# Patient Record
Sex: Female | Born: 1950
Health system: Southern US, Community
[De-identification: ages and names within clinical notes are randomized; demographics above are authoritative.]

## PROBLEM LIST (undated history)

## (undated) DIAGNOSIS — I10 Essential (primary) hypertension: Secondary | ICD-10-CM

## (undated) DIAGNOSIS — J302 Other seasonal allergic rhinitis: Secondary | ICD-10-CM

## (undated) HISTORY — DX: Other seasonal allergic rhinitis: J30.2

## (undated) HISTORY — PX: TRIGGER FINGER RELEASE: SHX641

## (undated) HISTORY — PX: CARPAL TUNNEL RELEASE: SHX101

## (undated) HISTORY — DX: Essential (primary) hypertension: I10

## (undated) HISTORY — PX: SPINAL FUSION: SHX223

---

## 2018-12-06 DIAGNOSIS — L570 Actinic keratosis: Secondary | ICD-10-CM | POA: Diagnosis not present

## 2018-12-06 DIAGNOSIS — L579 Skin changes due to chronic exposure to nonionizing radiation, unspecified: Secondary | ICD-10-CM | POA: Diagnosis not present

## 2019-01-23 DIAGNOSIS — L570 Actinic keratosis: Secondary | ICD-10-CM | POA: Diagnosis not present

## 2019-02-02 DIAGNOSIS — I10 Essential (primary) hypertension: Secondary | ICD-10-CM | POA: Diagnosis not present

## 2019-02-02 DIAGNOSIS — E782 Mixed hyperlipidemia: Secondary | ICD-10-CM | POA: Diagnosis not present

## 2019-02-14 DIAGNOSIS — R05 Cough: Secondary | ICD-10-CM | POA: Diagnosis not present

## 2019-02-14 DIAGNOSIS — E782 Mixed hyperlipidemia: Secondary | ICD-10-CM | POA: Diagnosis not present

## 2019-02-14 DIAGNOSIS — I781 Nevus, non-neoplastic: Secondary | ICD-10-CM | POA: Diagnosis not present

## 2019-02-14 DIAGNOSIS — R69 Illness, unspecified: Secondary | ICD-10-CM | POA: Diagnosis not present

## 2019-02-14 DIAGNOSIS — R7301 Impaired fasting glucose: Secondary | ICD-10-CM | POA: Diagnosis not present

## 2019-02-14 DIAGNOSIS — I10 Essential (primary) hypertension: Secondary | ICD-10-CM | POA: Diagnosis not present

## 2019-02-14 DIAGNOSIS — Z6828 Body mass index (BMI) 28.0-28.9, adult: Secondary | ICD-10-CM | POA: Diagnosis not present

## 2019-02-21 DIAGNOSIS — L820 Inflamed seborrheic keratosis: Secondary | ICD-10-CM | POA: Diagnosis not present

## 2019-02-21 DIAGNOSIS — I781 Nevus, non-neoplastic: Secondary | ICD-10-CM | POA: Diagnosis not present

## 2019-02-21 DIAGNOSIS — Z6828 Body mass index (BMI) 28.0-28.9, adult: Secondary | ICD-10-CM | POA: Diagnosis not present

## 2019-03-16 DIAGNOSIS — Z6829 Body mass index (BMI) 29.0-29.9, adult: Secondary | ICD-10-CM | POA: Diagnosis not present

## 2019-03-16 DIAGNOSIS — R05 Cough: Secondary | ICD-10-CM | POA: Diagnosis not present

## 2019-03-16 DIAGNOSIS — I10 Essential (primary) hypertension: Secondary | ICD-10-CM | POA: Diagnosis not present

## 2019-03-16 DIAGNOSIS — E782 Mixed hyperlipidemia: Secondary | ICD-10-CM | POA: Diagnosis not present

## 2019-05-14 DIAGNOSIS — H04123 Dry eye syndrome of bilateral lacrimal glands: Secondary | ICD-10-CM | POA: Diagnosis not present

## 2019-06-04 DIAGNOSIS — R05 Cough: Secondary | ICD-10-CM | POA: Diagnosis not present

## 2019-06-04 DIAGNOSIS — Z20828 Contact with and (suspected) exposure to other viral communicable diseases: Secondary | ICD-10-CM | POA: Diagnosis not present

## 2019-06-04 DIAGNOSIS — Z6829 Body mass index (BMI) 29.0-29.9, adult: Secondary | ICD-10-CM | POA: Diagnosis not present

## 2019-06-04 DIAGNOSIS — M25511 Pain in right shoulder: Secondary | ICD-10-CM | POA: Diagnosis not present

## 2019-06-06 DIAGNOSIS — R05 Cough: Secondary | ICD-10-CM | POA: Diagnosis not present

## 2019-06-12 DIAGNOSIS — M67813 Other specified disorders of tendon, right shoulder: Secondary | ICD-10-CM | POA: Diagnosis not present

## 2019-06-12 DIAGNOSIS — M7551 Bursitis of right shoulder: Secondary | ICD-10-CM | POA: Diagnosis not present

## 2019-07-04 DIAGNOSIS — R05 Cough: Secondary | ICD-10-CM | POA: Diagnosis not present

## 2019-07-04 DIAGNOSIS — Z6828 Body mass index (BMI) 28.0-28.9, adult: Secondary | ICD-10-CM | POA: Diagnosis not present

## 2019-07-04 DIAGNOSIS — M25511 Pain in right shoulder: Secondary | ICD-10-CM | POA: Diagnosis not present

## 2019-08-03 DIAGNOSIS — Z1231 Encounter for screening mammogram for malignant neoplasm of breast: Secondary | ICD-10-CM | POA: Diagnosis not present

## 2019-10-17 DIAGNOSIS — I10 Essential (primary) hypertension: Secondary | ICD-10-CM | POA: Diagnosis not present

## 2019-10-17 DIAGNOSIS — E782 Mixed hyperlipidemia: Secondary | ICD-10-CM | POA: Diagnosis not present

## 2019-10-17 DIAGNOSIS — R7301 Impaired fasting glucose: Secondary | ICD-10-CM | POA: Diagnosis not present

## 2019-10-24 DIAGNOSIS — R7301 Impaired fasting glucose: Secondary | ICD-10-CM | POA: Diagnosis not present

## 2019-10-24 DIAGNOSIS — E782 Mixed hyperlipidemia: Secondary | ICD-10-CM | POA: Diagnosis not present

## 2019-10-24 DIAGNOSIS — K219 Gastro-esophageal reflux disease without esophagitis: Secondary | ICD-10-CM | POA: Diagnosis not present

## 2019-10-24 DIAGNOSIS — Z23 Encounter for immunization: Secondary | ICD-10-CM | POA: Diagnosis not present

## 2019-10-24 DIAGNOSIS — R69 Illness, unspecified: Secondary | ICD-10-CM | POA: Diagnosis not present

## 2019-10-24 DIAGNOSIS — Z0001 Encounter for general adult medical examination with abnormal findings: Secondary | ICD-10-CM | POA: Diagnosis not present

## 2019-10-24 DIAGNOSIS — Z6829 Body mass index (BMI) 29.0-29.9, adult: Secondary | ICD-10-CM | POA: Diagnosis not present

## 2019-10-24 DIAGNOSIS — I10 Essential (primary) hypertension: Secondary | ICD-10-CM | POA: Diagnosis not present

## 2019-11-05 DIAGNOSIS — H26493 Other secondary cataract, bilateral: Secondary | ICD-10-CM | POA: Diagnosis not present

## 2019-11-20 DIAGNOSIS — Z03818 Encounter for observation for suspected exposure to other biological agents ruled out: Secondary | ICD-10-CM | POA: Diagnosis not present

## 2019-12-11 DIAGNOSIS — K5730 Diverticulosis of large intestine without perforation or abscess without bleeding: Secondary | ICD-10-CM | POA: Diagnosis not present

## 2019-12-11 DIAGNOSIS — Z1211 Encounter for screening for malignant neoplasm of colon: Secondary | ICD-10-CM | POA: Diagnosis not present

## 2019-12-11 DIAGNOSIS — D128 Benign neoplasm of rectum: Secondary | ICD-10-CM | POA: Diagnosis not present

## 2019-12-31 DIAGNOSIS — Z20828 Contact with and (suspected) exposure to other viral communicable diseases: Secondary | ICD-10-CM | POA: Diagnosis not present

## 2020-01-27 DIAGNOSIS — Z20828 Contact with and (suspected) exposure to other viral communicable diseases: Secondary | ICD-10-CM | POA: Diagnosis not present

## 2020-02-05 DIAGNOSIS — M7552 Bursitis of left shoulder: Secondary | ICD-10-CM | POA: Diagnosis not present

## 2020-02-05 DIAGNOSIS — R69 Illness, unspecified: Secondary | ICD-10-CM | POA: Diagnosis not present

## 2020-02-05 DIAGNOSIS — I10 Essential (primary) hypertension: Secondary | ICD-10-CM | POA: Diagnosis not present

## 2020-02-05 DIAGNOSIS — M7542 Impingement syndrome of left shoulder: Secondary | ICD-10-CM | POA: Diagnosis not present

## 2020-02-25 DIAGNOSIS — H524 Presbyopia: Secondary | ICD-10-CM | POA: Diagnosis not present

## 2020-05-13 ENCOUNTER — Other Ambulatory Visit: Payer: Self-pay | Admitting: Physician Assistant

## 2020-05-13 DIAGNOSIS — M25849 Other specified joint disorders, unspecified hand: Secondary | ICD-10-CM | POA: Diagnosis not present

## 2020-05-13 DIAGNOSIS — I10 Essential (primary) hypertension: Secondary | ICD-10-CM | POA: Diagnosis not present

## 2020-05-13 DIAGNOSIS — E559 Vitamin D deficiency, unspecified: Secondary | ICD-10-CM | POA: Diagnosis not present

## 2020-05-13 DIAGNOSIS — E785 Hyperlipidemia, unspecified: Secondary | ICD-10-CM | POA: Diagnosis not present

## 2020-05-13 DIAGNOSIS — E041 Nontoxic single thyroid nodule: Secondary | ICD-10-CM | POA: Diagnosis not present

## 2020-05-13 DIAGNOSIS — E2839 Other primary ovarian failure: Secondary | ICD-10-CM | POA: Diagnosis not present

## 2020-05-13 DIAGNOSIS — R69 Illness, unspecified: Secondary | ICD-10-CM | POA: Diagnosis not present

## 2020-05-13 DIAGNOSIS — R05 Cough: Secondary | ICD-10-CM | POA: Diagnosis not present

## 2020-05-14 ENCOUNTER — Other Ambulatory Visit: Payer: Self-pay | Admitting: Physician Assistant

## 2020-05-14 DIAGNOSIS — E041 Nontoxic single thyroid nodule: Secondary | ICD-10-CM

## 2020-05-22 ENCOUNTER — Other Ambulatory Visit: Payer: Self-pay | Admitting: Physician Assistant

## 2020-05-22 DIAGNOSIS — E2839 Other primary ovarian failure: Secondary | ICD-10-CM

## 2020-05-26 ENCOUNTER — Ambulatory Visit
Admission: RE | Admit: 2020-05-26 | Discharge: 2020-05-26 | Disposition: A | Discharge status: Home / Self Care | Payer: Medicare HMO | Source: Ambulatory Visit | Attending: Physician Assistant | Admitting: Physician Assistant

## 2020-05-26 DIAGNOSIS — E041 Nontoxic single thyroid nodule: Secondary | ICD-10-CM

## 2020-05-26 DIAGNOSIS — E042 Nontoxic multinodular goiter: Secondary | ICD-10-CM | POA: Diagnosis not present

## 2020-06-04 DIAGNOSIS — M7552 Bursitis of left shoulder: Secondary | ICD-10-CM | POA: Diagnosis not present

## 2020-06-04 DIAGNOSIS — M1811 Unilateral primary osteoarthritis of first carpometacarpal joint, right hand: Secondary | ICD-10-CM | POA: Diagnosis not present

## 2020-06-04 DIAGNOSIS — M1812 Unilateral primary osteoarthritis of first carpometacarpal joint, left hand: Secondary | ICD-10-CM | POA: Diagnosis not present

## 2020-07-03 DIAGNOSIS — M7552 Bursitis of left shoulder: Secondary | ICD-10-CM | POA: Diagnosis not present

## 2020-07-03 DIAGNOSIS — M1811 Unilateral primary osteoarthritis of first carpometacarpal joint, right hand: Secondary | ICD-10-CM | POA: Diagnosis not present

## 2020-07-03 DIAGNOSIS — M1812 Unilateral primary osteoarthritis of first carpometacarpal joint, left hand: Secondary | ICD-10-CM | POA: Diagnosis not present

## 2020-07-07 ENCOUNTER — Ambulatory Visit
Admission: RE | Admit: 2020-07-07 | Discharge: 2020-07-07 | Disposition: A | Discharge status: Home / Self Care | Payer: Medicare HMO | Source: Ambulatory Visit | Attending: Physician Assistant | Admitting: Physician Assistant

## 2020-07-07 ENCOUNTER — Other Ambulatory Visit: Payer: Self-pay

## 2020-07-07 DIAGNOSIS — E2839 Other primary ovarian failure: Secondary | ICD-10-CM

## 2020-07-07 DIAGNOSIS — Z780 Asymptomatic menopausal state: Secondary | ICD-10-CM | POA: Diagnosis not present

## 2020-07-07 DIAGNOSIS — M85852 Other specified disorders of bone density and structure, left thigh: Secondary | ICD-10-CM | POA: Diagnosis not present

## 2020-07-16 DIAGNOSIS — R69 Illness, unspecified: Secondary | ICD-10-CM | POA: Diagnosis not present

## 2020-07-30 DIAGNOSIS — R69 Illness, unspecified: Secondary | ICD-10-CM | POA: Diagnosis not present

## 2020-08-05 DIAGNOSIS — R69 Illness, unspecified: Secondary | ICD-10-CM | POA: Diagnosis not present

## 2020-09-15 DIAGNOSIS — R69 Illness, unspecified: Secondary | ICD-10-CM | POA: Diagnosis not present

## 2020-11-26 DIAGNOSIS — H52223 Regular astigmatism, bilateral: Secondary | ICD-10-CM | POA: Diagnosis not present

## 2020-11-26 DIAGNOSIS — I10 Essential (primary) hypertension: Secondary | ICD-10-CM | POA: Diagnosis not present

## 2020-11-26 DIAGNOSIS — E7800 Pure hypercholesterolemia, unspecified: Secondary | ICD-10-CM | POA: Diagnosis not present

## 2020-12-11 DIAGNOSIS — Z20822 Contact with and (suspected) exposure to covid-19: Secondary | ICD-10-CM | POA: Diagnosis not present

## 2020-12-12 DIAGNOSIS — Z961 Presence of intraocular lens: Secondary | ICD-10-CM | POA: Diagnosis not present

## 2020-12-12 DIAGNOSIS — I10 Essential (primary) hypertension: Secondary | ICD-10-CM | POA: Diagnosis not present

## 2020-12-12 DIAGNOSIS — H26492 Other secondary cataract, left eye: Secondary | ICD-10-CM | POA: Diagnosis not present

## 2020-12-16 DIAGNOSIS — H26492 Other secondary cataract, left eye: Secondary | ICD-10-CM | POA: Diagnosis not present

## 2020-12-24 DIAGNOSIS — L570 Actinic keratosis: Secondary | ICD-10-CM | POA: Diagnosis not present

## 2021-01-02 DIAGNOSIS — H26491 Other secondary cataract, right eye: Secondary | ICD-10-CM | POA: Diagnosis not present

## 2021-01-19 DIAGNOSIS — Z0100 Encounter for examination of eyes and vision without abnormal findings: Secondary | ICD-10-CM | POA: Diagnosis not present

## 2021-01-27 DIAGNOSIS — I10 Essential (primary) hypertension: Secondary | ICD-10-CM | POA: Diagnosis not present

## 2021-01-27 DIAGNOSIS — Z79890 Hormone replacement therapy (postmenopausal): Secondary | ICD-10-CM | POA: Diagnosis not present

## 2021-01-27 DIAGNOSIS — Z1231 Encounter for screening mammogram for malignant neoplasm of breast: Secondary | ICD-10-CM | POA: Diagnosis not present

## 2021-01-27 DIAGNOSIS — E559 Vitamin D deficiency, unspecified: Secondary | ICD-10-CM | POA: Diagnosis not present

## 2021-01-27 DIAGNOSIS — Z0000 Encounter for general adult medical examination without abnormal findings: Secondary | ICD-10-CM | POA: Diagnosis not present

## 2021-01-27 DIAGNOSIS — H9191 Unspecified hearing loss, right ear: Secondary | ICD-10-CM | POA: Diagnosis not present

## 2021-01-27 DIAGNOSIS — R69 Illness, unspecified: Secondary | ICD-10-CM | POA: Diagnosis not present

## 2021-01-27 DIAGNOSIS — M85859 Other specified disorders of bone density and structure, unspecified thigh: Secondary | ICD-10-CM | POA: Diagnosis not present

## 2021-01-27 DIAGNOSIS — E785 Hyperlipidemia, unspecified: Secondary | ICD-10-CM | POA: Diagnosis not present

## 2021-01-30 ENCOUNTER — Other Ambulatory Visit: Payer: Self-pay | Admitting: Family Medicine

## 2021-01-30 DIAGNOSIS — Z1231 Encounter for screening mammogram for malignant neoplasm of breast: Secondary | ICD-10-CM

## 2021-03-03 DIAGNOSIS — F419 Anxiety disorder, unspecified: Secondary | ICD-10-CM | POA: Diagnosis not present

## 2021-03-03 DIAGNOSIS — I10 Essential (primary) hypertension: Secondary | ICD-10-CM | POA: Diagnosis not present

## 2021-03-03 DIAGNOSIS — E871 Hypo-osmolality and hyponatremia: Secondary | ICD-10-CM | POA: Diagnosis not present

## 2021-03-03 DIAGNOSIS — R69 Illness, unspecified: Secondary | ICD-10-CM | POA: Diagnosis not present

## 2021-04-06 DIAGNOSIS — H903 Sensorineural hearing loss, bilateral: Secondary | ICD-10-CM | POA: Diagnosis not present

## 2021-04-06 DIAGNOSIS — L299 Pruritus, unspecified: Secondary | ICD-10-CM | POA: Diagnosis not present

## 2021-04-20 ENCOUNTER — Telehealth: Payer: Self-pay | Admitting: Pulmonary Disease

## 2021-04-22 ENCOUNTER — Ambulatory Visit: Payer: Medicare HMO | Admitting: Pulmonary Disease

## 2021-04-22 ENCOUNTER — Other Ambulatory Visit: Payer: Self-pay

## 2021-04-22 ENCOUNTER — Encounter: Payer: Self-pay | Admitting: Pulmonary Disease

## 2021-04-22 VITALS — BP 116/76 | HR 82 | Temp 98.0°F | Ht <= 58 in | Wt 133.0 lb

## 2021-04-22 DIAGNOSIS — J302 Other seasonal allergic rhinitis: Secondary | ICD-10-CM

## 2021-04-22 DIAGNOSIS — R911 Solitary pulmonary nodule: Secondary | ICD-10-CM

## 2021-04-22 DIAGNOSIS — R053 Chronic cough: Secondary | ICD-10-CM | POA: Diagnosis not present

## 2021-04-22 DIAGNOSIS — K449 Diaphragmatic hernia without obstruction or gangrene: Secondary | ICD-10-CM | POA: Diagnosis not present

## 2021-04-22 NOTE — Patient Instructions (Addendum)
Thank you for visiting Dr. Valeta Harms at [facility]. Today we recommend the following:  Orders Placed This Encounter  Procedures  . CT CHEST HIGH RESOLUTION   Return in about 4 weeks (around 05/20/2021) for with APP or Dr. Valeta Harms.    Please do your part to reduce the spread of COVID-19.

## 2021-04-22 NOTE — Telephone Encounter (Signed)
Pt seen today 04/22/21 by Dr. Valeta Harms. Will close encounter.

## 2021-04-22 NOTE — Progress Notes (Signed)
Synopsis: Referred in June 2022 for chronic cough, PCP: By Caren Macadam, MD  Subjective:   PATIENT ID: Tanya Barron GENDER: female DOB: 03-30-1951, MRN: [id number]  Chief Complaint  Patient presents with  . Consult    Dry cough    PMH of chronic cough. She had work up for chronic cough 7 years ago. She has seen ENT and had a barium swallow study.  She states this work-up was relatively unrevealing.  Her cough is episodic at times.  Occurs at nighttime.  Currently on an antihistamine and PPI.  She has not noticed much difference in her symptoms throughout the years.  It tends to be episodic.  She does not associate it with any mealtime variance.  She does have reflux symptoms at times.  She feels like she can control her cough sometimes by manipulating her upper neck.  It feels like she can point to the location that causes a slight tickle in her throat which irritates the cough.  Denies hemoptysis.  No other warning symptoms.    Past Medical History:  Diagnosis Date  . HTN (hypertension)   . Seasonal allergies      Family History  Problem Relation Age of Onset  . Alzheimer's disease Mother   . Heart disease Father      Past Surgical History:  Procedure Laterality Date  . CARPAL TUNNEL RELEASE    . SPINAL FUSION    . TRIGGER FINGER RELEASE      Social History   Socioeconomic History  . Marital status: Divorced    Spouse name: Not on file  . Number of children: Not on file  . Years of education: Not on file  . Highest education level: Not on file  Occupational History  . Not on file  Tobacco Use  . Smoking status: Former Smoker    Packs/day: 1.00    Years: 12.00    Pack years: 12.00    Quit date: 1985    Years since quitting: 37.4  . Smokeless tobacco: Never Used  Substance and Sexual Activity  . Alcohol use: Yes  . Drug use: Not on file  . Sexual activity: Not on file  Other Topics Concern  . Not on file  Social History Narrative  . Not on file    Social Determinants of Health   Financial Resource Strain: Not on file  Food Insecurity: Not on file  Transportation Needs: Not on file  Physical Activity: Not on file  Stress: Not on file  Social Connections: Not on file  Intimate Partner Violence: Not on file     Not on File   Outpatient Medications Prior to Visit  Medication Sig Dispense Refill  . amLODipine (NORVASC) 5 MG tablet     . atorvastatin (LIPITOR) 40 MG tablet Take 1 tablet by mouth daily.    . cetirizine (ZYRTEC) 10 MG tablet Take by mouth.    . pantoprazole (PROTONIX) 40 MG tablet Take 1 tablet by mouth daily.    Marland Kitchen PARoxetine (PAXIL) 40 MG tablet Take 60 mg by mouth daily.    Marland Kitchen triamterene-hydrochlorothiazide (DYAZIDE) 37.5-25 MG capsule      No facility-administered medications prior to visit.    Review of Systems  Constitutional: Negative for chills, fever, malaise/fatigue and weight loss.  HENT: Negative for hearing loss, sore throat and tinnitus.   Eyes: Negative for blurred vision and double vision.  Respiratory: Positive for cough. Negative for hemoptysis, sputum production, shortness of breath, wheezing and stridor.  Cardiovascular: Negative for chest pain, palpitations, orthopnea, leg swelling and PND.  Gastrointestinal: Negative for abdominal pain, constipation, diarrhea, heartburn, nausea and vomiting.  Genitourinary: Negative for dysuria, hematuria and urgency.  Musculoskeletal: Negative for joint pain and myalgias.  Skin: Negative for itching and rash.  Neurological: Negative for dizziness, tingling, weakness and headaches.  Endo/Heme/Allergies: Negative for environmental allergies. Does not bruise/bleed easily.  Psychiatric/Behavioral: Negative for depression. The patient is not nervous/anxious and does not have insomnia.   All other systems reviewed and are negative.    Objective:  Physical Exam Vitals reviewed.  Constitutional:      General: She is not in acute distress.     Appearance: She is well-developed.  HENT:     Head: Normocephalic and atraumatic.  Eyes:     General: No scleral icterus.    Conjunctiva/sclera: Conjunctivae normal.     Pupils: Pupils are equal, round, and reactive to light.  Neck:     Vascular: No JVD.     Trachea: No tracheal deviation.  Cardiovascular:     Rate and Rhythm: Normal rate and regular rhythm.     Heart sounds: Normal heart sounds. No murmur heard.   Pulmonary:     Effort: Pulmonary effort is normal. No tachypnea, accessory muscle usage or respiratory distress.     Breath sounds: Normal breath sounds. No stridor. No wheezing, rhonchi or rales.  Abdominal:     General: Bowel sounds are normal. There is no distension.     Palpations: Abdomen is soft.     Tenderness: There is no abdominal tenderness.  Musculoskeletal:        General: No tenderness.     Cervical back: Neck supple.     Comments: Arthritis of the hands  Lymphadenopathy:     Cervical: No cervical adenopathy.  Skin:    General: Skin is warm and dry.     Capillary Refill: Capillary refill takes less than 2 seconds.     Findings: No rash.  Neurological:     Mental Status: She is alert and oriented to person, place, and time.  Psychiatric:        Behavior: Behavior normal.      Vitals:   04/22/21 0921  BP: 116/76  Pulse: 82  Temp: 98 F (36.7 C)  SpO2: 95%  Weight: 133 lb (60.3 kg)  Height: 4\' 10"  (1.473 m)   95% on RA BMI Readings from Last 3 Encounters:  04/22/21 27.80 kg/m   Wt Readings from Last 3 Encounters:  04/22/21 133 lb (60.3 kg)    CBC No results found for: WBC, RBC, HGB, HCT, PLT, MCV, MCH, MCHC, RDW, LYMPHSABS, MONOABS, EOSABS, BASOSABS  Chest Imaging: No CT imaging on file.  Pulmonary Functions Testing Results: No flowsheet data found.  FeNO:  Pathology:  Echocardiogram:  Heart Catheterization:     Assessment & Plan:     ICD-10-CM   1. Chronic cough  R05.3 CT CHEST HIGH RESOLUTION  2. Lung nodule  R91.1 CT  CHEST HIGH RESOLUTION  3. Hiatal hernia  K44.9 CT CHEST HIGH RESOLUTION  4. Seasonal allergies  J30.2     Discussion:  This is a 70 year old female, chronic cough for 10+ years, history of lung nodule, no previous CT imaging to review.  This was all work-up that was completed in [place] prior to her moving down here a year ago.  She has a hiatal hernia.  Also no imaging to review.  Additional diagnosis of seasonal allergies.  Was recently  seen by ENT due to hearing loss as well as complaints of ongoing cough.  Cough has been present for some time.  Plan: Will recommend having CT imaging of the chest for evaluation of lung nodule and hiatal hernia. This also give [place] a look at the lung parenchyma for ongoing chronic cough issues. If her hiatal hernia is big enough to consider referral for having this fixed.  We will set her up with one of the thoracic surgeons who does this robotically.  Patient is agreeable to this plan. Can continue current PPI and antihistamine regimen for cough.  Patient to return to see me or APP in clinic after CT imaging of the chest   Current Outpatient Medications:  .  amLODipine (NORVASC) 5 MG tablet, , Disp: , Rfl:  .  atorvastatin (LIPITOR) 40 MG tablet, Take 1 tablet by mouth daily., Disp: , Rfl:  .  cetirizine (ZYRTEC) 10 MG tablet, Take by mouth., Disp: , Rfl:  .  pantoprazole (PROTONIX) 40 MG tablet, Take 1 tablet by mouth daily., Disp: , Rfl:  .  PARoxetine (PAXIL) 40 MG tablet, Take 60 mg by mouth daily., Disp: , Rfl:  .  triamterene-hydrochlorothiazide (DYAZIDE) 37.5-25 MG capsule, , Disp: , Rfl:     Garner Nash, DO [facility] 04/22/2021 10:07 AM

## 2021-04-30 DIAGNOSIS — N952 Postmenopausal atrophic vaginitis: Secondary | ICD-10-CM | POA: Diagnosis not present

## 2021-04-30 DIAGNOSIS — Z01419 Encounter for gynecological examination (general) (routine) without abnormal findings: Secondary | ICD-10-CM | POA: Diagnosis not present

## 2021-05-07 ENCOUNTER — Ambulatory Visit
Admission: RE | Admit: 2021-05-07 | Discharge: 2021-05-07 | Disposition: A | Discharge status: Home / Self Care | Payer: Medicare HMO | Source: Ambulatory Visit | Attending: Family Medicine | Admitting: Family Medicine

## 2021-05-07 ENCOUNTER — Other Ambulatory Visit: Payer: Self-pay

## 2021-05-07 DIAGNOSIS — Z1231 Encounter for screening mammogram for malignant neoplasm of breast: Secondary | ICD-10-CM

## 2021-05-08 ENCOUNTER — Ambulatory Visit
Admission: RE | Admit: 2021-05-08 | Discharge: 2021-05-08 | Disposition: A | Discharge status: Home / Self Care | Payer: Medicare HMO | Source: Ambulatory Visit | Attending: Pulmonary Disease | Admitting: Pulmonary Disease

## 2021-05-08 DIAGNOSIS — R911 Solitary pulmonary nodule: Secondary | ICD-10-CM

## 2021-05-08 DIAGNOSIS — I700 Atherosclerosis of aorta: Secondary | ICD-10-CM | POA: Diagnosis not present

## 2021-05-08 DIAGNOSIS — M419 Scoliosis, unspecified: Secondary | ICD-10-CM | POA: Diagnosis not present

## 2021-05-08 DIAGNOSIS — I2510 Atherosclerotic heart disease of native coronary artery without angina pectoris: Secondary | ICD-10-CM | POA: Diagnosis not present

## 2021-05-08 DIAGNOSIS — J479 Bronchiectasis, uncomplicated: Secondary | ICD-10-CM | POA: Diagnosis not present

## 2021-05-08 DIAGNOSIS — K449 Diaphragmatic hernia without obstruction or gangrene: Secondary | ICD-10-CM

## 2021-05-08 DIAGNOSIS — R053 Chronic cough: Secondary | ICD-10-CM

## 2021-05-12 ENCOUNTER — Other Ambulatory Visit: Payer: Self-pay

## 2021-05-12 ENCOUNTER — Ambulatory Visit (INDEPENDENT_AMBULATORY_CARE_PROVIDER_SITE_OTHER): Payer: Medicare HMO | Admitting: Psychology

## 2021-05-12 DIAGNOSIS — R69 Illness, unspecified: Secondary | ICD-10-CM | POA: Diagnosis not present

## 2021-05-12 DIAGNOSIS — F418 Other specified anxiety disorders: Secondary | ICD-10-CM | POA: Diagnosis not present

## 2021-05-22 ENCOUNTER — Other Ambulatory Visit: Payer: Self-pay

## 2021-05-22 ENCOUNTER — Ambulatory Visit: Payer: Medicare HMO | Admitting: Pulmonary Disease

## 2021-05-22 ENCOUNTER — Encounter: Payer: Self-pay | Admitting: Pulmonary Disease

## 2021-05-22 VITALS — BP 118/76 | HR 105 | Ht <= 58 in | Wt 135.0 lb

## 2021-05-22 DIAGNOSIS — R053 Chronic cough: Secondary | ICD-10-CM | POA: Diagnosis not present

## 2021-05-22 DIAGNOSIS — J302 Other seasonal allergic rhinitis: Secondary | ICD-10-CM | POA: Diagnosis not present

## 2021-05-22 DIAGNOSIS — R911 Solitary pulmonary nodule: Secondary | ICD-10-CM | POA: Diagnosis not present

## 2021-05-22 DIAGNOSIS — I7090 Unspecified atherosclerosis: Secondary | ICD-10-CM

## 2021-05-22 DIAGNOSIS — Q859 Phakomatosis, unspecified: Secondary | ICD-10-CM

## 2021-05-22 NOTE — Patient Instructions (Signed)
Thank you for visiting Dr. Valeta Harms at [facility]. Today we recommend the following:  Orders Placed This Encounter  Procedures   Ambulatory referral to Cardiology   Ambulatory referral to ENT   Let us know if symptoms get worse  Return in about 1 year (around 05/22/2022), or if symptoms worsen or fail to improve, for with APP or Dr. Valeta Harms.    Please do your part to reduce the spread of COVID-19.

## 2021-05-22 NOTE — Progress Notes (Signed)
Synopsis: Referred in June 2022 for chronic cough, PCP: By Jenny Reichmann, P*  Subjective:   PATIENT ID: Tanya Barron GENDER: female DOB: 09-04-1951, MRN: [id number]  Chief Complaint  Patient presents with   Cough    PMH of chronic cough. She had work up for chronic cough 7 years ago. She has seen ENT and had a barium swallow study.  She states this work-up was relatively unrevealing.  Her cough is episodic at times.  Occurs at nighttime.  Currently on an antihistamine and PPI.  She has not noticed much difference in her symptoms throughout the years.  It tends to be episodic.  She does not associate it with any mealtime variance.  She does have reflux symptoms at times.  She feels like she can control her cough sometimes by manipulating her upper neck.  It feels like she can point to the location that causes a slight tickle in her throat which irritates the cough.  Denies hemoptysis.  No other warning symptoms.  OV 05/22/2021: Here for follow-up after recent CT scan of the chest.  Still has chronic cough.  Has not changed much.  CT scan findings with small Bochdalek hernia.  Unfortunately still has a cough.  CT scan did not reveal much of the potential underlying etiology.  She does not have a hiatal hernia. Overall, no issues today except for the persistent cough.      Past Medical History:  Diagnosis Date   HTN (hypertension)    Seasonal allergies      Family History  Problem Relation Age of Onset   Alzheimer's disease Mother    Heart disease Father      Past Surgical History:  Procedure Laterality Date   CARPAL TUNNEL RELEASE     SPINAL FUSION     TRIGGER FINGER RELEASE      Social History   Socioeconomic History   Marital status: Divorced    Spouse name: Not on file   Number of children: Not on file   Years of education: Not on file   Highest education level: Not on file  Occupational History   Not on file  Tobacco Use   Smoking status: Former     Packs/day: 1.00    Years: 12.00    Pack years: 12.00    Types: Cigarettes    Quit date: 42    Years since quitting: 37.5   Smokeless tobacco: Never  Substance and Sexual Activity   Alcohol use: Yes   Drug use: Not on file   Sexual activity: Not on file  Other Topics Concern   Not on file  Social History Narrative   Not on file   Social Determinants of Health   Financial Resource Strain: Not on file  Food Insecurity: Not on file  Transportation Needs: Not on file  Physical Activity: Not on file  Stress: Not on file  Social Connections: Not on file  Intimate Partner Violence: Not on file     Allergies  Allergen Reactions   Sulfamethoxazole-Trimethoprim Hives     Outpatient Medications Prior to Visit  Medication Sig Dispense Refill   amLODipine (NORVASC) 5 MG tablet      atorvastatin (LIPITOR) 40 MG tablet Take 1 tablet by mouth daily.     cetirizine (ZYRTEC) 10 MG tablet Take by mouth.     pantoprazole (PROTONIX) 40 MG tablet Take 1 tablet by mouth daily.     PARoxetine (PAXIL) 40 MG tablet Take 60 mg by mouth  daily.     triamterene-hydrochlorothiazide (DYAZIDE) 37.5-25 MG capsule      No facility-administered medications prior to visit.    Review of Systems  Constitutional:  Negative for chills, fever, malaise/fatigue and weight loss.  HENT:  Negative for hearing loss, sore throat and tinnitus.   Eyes:  Negative for blurred vision and double vision.  Respiratory:  Positive for cough. Negative for hemoptysis, sputum production, shortness of breath, wheezing and stridor.   Cardiovascular:  Negative for chest pain, palpitations, orthopnea, leg swelling and PND.  Gastrointestinal:  Negative for abdominal pain, constipation, diarrhea, heartburn, nausea and vomiting.  Genitourinary:  Negative for dysuria, hematuria and urgency.  Musculoskeletal:  Negative for joint pain and myalgias.  Skin:  Negative for itching and rash.  Neurological:  Negative for dizziness,  tingling, weakness and headaches.  Endo/Heme/Allergies:  Negative for environmental allergies. Does not bruise/bleed easily.  Psychiatric/Behavioral:  Negative for depression. The patient is not nervous/anxious and does not have insomnia.   All other systems reviewed and are negative.   Objective:  Physical Exam Vitals reviewed.  Constitutional:      General: She is not in acute distress.    Appearance: She is well-developed.  HENT:     Head: Normocephalic and atraumatic.  Eyes:     General: No scleral icterus.    Conjunctiva/sclera: Conjunctivae normal.     Pupils: Pupils are equal, round, and reactive to light.  Neck:     Vascular: No JVD.     Trachea: No tracheal deviation.  Cardiovascular:     Rate and Rhythm: Normal rate and regular rhythm.     Heart sounds: Normal heart sounds. No murmur heard. Pulmonary:     Effort: Pulmonary effort is normal. No tachypnea, accessory muscle usage or respiratory distress.     Breath sounds: Normal breath sounds. No stridor. No wheezing, rhonchi or rales.  Abdominal:     General: Bowel sounds are normal. There is no distension.     Palpations: Abdomen is soft.     Tenderness: There is no abdominal tenderness.  Musculoskeletal:        General: No tenderness.     Cervical back: Neck supple.     Comments: Severe kyphoscoliosis   Lymphadenopathy:     Cervical: No cervical adenopathy.  Skin:    General: Skin is warm and dry.     Capillary Refill: Capillary refill takes less than 2 seconds.     Findings: No rash.  Neurological:     Mental Status: She is alert and oriented to person, place, and time.  Psychiatric:        Behavior: Behavior normal.     Vitals:   05/22/21 1452  BP: 118/76  Pulse: (!) 105  SpO2: 95%  Weight: 135 lb (61.2 kg)  Height: 4\' 10"  (1.473 m)   95% on RA BMI Readings from Last 3 Encounters:  05/22/21 28.22 kg/m  04/22/21 27.80 kg/m   Wt Readings from Last 3 Encounters:  05/22/21 135 lb (61.2 kg)   04/22/21 133 lb (60.3 kg)    CBC No results found for: WBC, RBC, HGB, HCT, PLT, MCV, MCH, MCHC, RDW, LYMPHSABS, MONOABS, EOSABS, BASOSABS  Chest Imaging:  CT Chest 05/08/2021: Small hamartoma, atherosclerosis, no ILD  Small hernia   Pulmonary Functions Testing Results: No flowsheet data found.  FeNO:  Pathology:  Echocardiogram:  Heart Catheterization:     Assessment & Plan:     ICD-10-CM   1. Chronic cough  R05.3 Ambulatory  referral to ENT    2. Atherosclerosis  I70.90 Ambulatory referral to Cardiology    3. Lung nodule  R91.1     4. Seasonal allergies  J30.2     5. Hamartoma ([place])  Q85.9       Discussion:  70 yo FM, chronic cough, 10 + years, lung nodule (hamartoma). Was seen by ENT before in [place]. Does have seasonal allergies.   Plan:  Reviewed CT imaging today  No additional need for lung nodule follow up  Continue allergy medications  She would like referred to see ENT again I recommended seeing a laryngologist which hasnt seen before.  As for the atherosclerosis on her CT I will refer her to cardiology and see if she would benefit from ct coronary.   Can see me or app in 1 year or as needed   Current Outpatient Medications:    amLODipine (NORVASC) 5 MG tablet, , Disp: , Rfl:    atorvastatin (LIPITOR) 40 MG tablet, Take 1 tablet by mouth daily., Disp: , Rfl:    cetirizine (ZYRTEC) 10 MG tablet, Take by mouth., Disp: , Rfl:    pantoprazole (PROTONIX) 40 MG tablet, Take 1 tablet by mouth daily., Disp: , Rfl:    PARoxetine (PAXIL) 40 MG tablet, Take 60 mg by mouth daily., Disp: , Rfl:    triamterene-hydrochlorothiazide (DYAZIDE) 37.5-25 MG capsule, , Disp: , Rfl:     Garner Nash, DO [place] [facility] 05/22/2021 3:02 PM

## 2021-05-26 ENCOUNTER — Ambulatory Visit (INDEPENDENT_AMBULATORY_CARE_PROVIDER_SITE_OTHER): Payer: Medicare HMO | Admitting: Psychology

## 2021-05-26 DIAGNOSIS — R69 Illness, unspecified: Secondary | ICD-10-CM | POA: Diagnosis not present

## 2021-05-26 DIAGNOSIS — F418 Other specified anxiety disorders: Secondary | ICD-10-CM

## 2021-06-09 ENCOUNTER — Other Ambulatory Visit: Payer: Self-pay

## 2021-06-09 ENCOUNTER — Ambulatory Visit (INDEPENDENT_AMBULATORY_CARE_PROVIDER_SITE_OTHER): Payer: Medicare HMO | Admitting: Psychology

## 2021-06-09 DIAGNOSIS — F418 Other specified anxiety disorders: Secondary | ICD-10-CM | POA: Diagnosis not present

## 2021-06-09 DIAGNOSIS — R69 Illness, unspecified: Secondary | ICD-10-CM | POA: Diagnosis not present

## 2021-06-12 DIAGNOSIS — R053 Chronic cough: Secondary | ICD-10-CM | POA: Diagnosis not present

## 2021-06-12 DIAGNOSIS — K219 Gastro-esophageal reflux disease without esophagitis: Secondary | ICD-10-CM | POA: Diagnosis not present

## 2021-06-12 DIAGNOSIS — J3489 Other specified disorders of nose and nasal sinuses: Secondary | ICD-10-CM | POA: Diagnosis not present

## 2021-06-12 DIAGNOSIS — J343 Hypertrophy of nasal turbinates: Secondary | ICD-10-CM | POA: Diagnosis not present

## 2021-06-30 ENCOUNTER — Ambulatory Visit (INDEPENDENT_AMBULATORY_CARE_PROVIDER_SITE_OTHER): Payer: Medicare HMO | Admitting: Psychology

## 2021-06-30 DIAGNOSIS — F418 Other specified anxiety disorders: Secondary | ICD-10-CM | POA: Diagnosis not present

## 2021-06-30 DIAGNOSIS — R69 Illness, unspecified: Secondary | ICD-10-CM | POA: Diagnosis not present

## 2021-07-14 ENCOUNTER — Other Ambulatory Visit: Payer: Self-pay

## 2021-07-14 ENCOUNTER — Ambulatory Visit (INDEPENDENT_AMBULATORY_CARE_PROVIDER_SITE_OTHER): Payer: Medicare HMO | Admitting: Psychology

## 2021-07-14 DIAGNOSIS — F418 Other specified anxiety disorders: Secondary | ICD-10-CM

## 2021-07-14 DIAGNOSIS — R69 Illness, unspecified: Secondary | ICD-10-CM | POA: Diagnosis not present

## 2021-07-15 DIAGNOSIS — R0989 Other specified symptoms and signs involving the circulatory and respiratory systems: Secondary | ICD-10-CM | POA: Diagnosis not present

## 2021-07-15 DIAGNOSIS — R053 Chronic cough: Secondary | ICD-10-CM | POA: Diagnosis not present

## 2021-07-15 DIAGNOSIS — K219 Gastro-esophageal reflux disease without esophagitis: Secondary | ICD-10-CM | POA: Diagnosis not present

## 2021-07-22 ENCOUNTER — Other Ambulatory Visit: Payer: Self-pay

## 2021-07-22 ENCOUNTER — Encounter (HOSPITAL_BASED_OUTPATIENT_CLINIC_OR_DEPARTMENT_OTHER): Payer: Self-pay | Admitting: Cardiology

## 2021-07-22 ENCOUNTER — Ambulatory Visit (HOSPITAL_BASED_OUTPATIENT_CLINIC_OR_DEPARTMENT_OTHER): Payer: Medicare HMO | Admitting: Cardiology

## 2021-07-22 VITALS — BP 112/80 | HR 82 | Ht <= 58 in | Wt 136.6 lb

## 2021-07-22 DIAGNOSIS — I2510 Atherosclerotic heart disease of native coronary artery without angina pectoris: Secondary | ICD-10-CM | POA: Diagnosis not present

## 2021-07-22 DIAGNOSIS — Z8249 Family history of ischemic heart disease and other diseases of the circulatory system: Secondary | ICD-10-CM

## 2021-07-22 DIAGNOSIS — I700 Atherosclerosis of aorta: Secondary | ICD-10-CM

## 2021-07-22 DIAGNOSIS — I10 Essential (primary) hypertension: Secondary | ICD-10-CM

## 2021-07-22 DIAGNOSIS — Z7189 Other specified counseling: Secondary | ICD-10-CM | POA: Diagnosis not present

## 2021-07-22 DIAGNOSIS — I2584 Coronary atherosclerosis due to calcified coronary lesion: Secondary | ICD-10-CM

## 2021-07-22 MED ORDER — ASPIRIN EC 81 MG PO TBEC
81.0000 mg | DELAYED_RELEASE_TABLET | Freq: Every day | ORAL | 3 refills | Status: AC
Start: 2021-07-22 — End: ?

## 2021-07-22 NOTE — Patient Instructions (Addendum)
Medication Instructions:  Start Aspirin 81 mg daily  *If you need a refill on your cardiac medications before your next appointment, please call your pharmacy*   Lab Work: None ordered today   Testing/Procedures: None ordered today   Follow-Up: At [facility], you and your health needs are our priority.  As part of our continuing mission to provide you with exceptional heart care, we have created designated Provider Care Teams.  These Care Teams include your primary Cardiologist (physician) and Advanced Practice Providers (APPs -  Physician Assistants and Nurse Practitioners) who all work together to provide you with the care you need, when you need it.  We recommend signing up for the patient portal called "MyChart".  Sign up information is provided on this After Visit Summary.  MyChart is used to connect with patients for Virtual Visits (Telemedicine).  Patients are able to view lab/test results, encounter notes, upcoming appointments, etc.  Non-urgent messages can be sent to your provider as well.   To learn more about what you can do with MyChart, go to [email or web address].    Your next appointment:   2 year(s)  The format for your next appointment:   In Person  Provider:   Buford Dresser, MD

## 2021-07-22 NOTE — Progress Notes (Signed)
Cardiology Office Note:    Date:  07/22/2021   ID:  Tanya Barron, DOB July 28, 1951, MRN [id number]  PCP:  Caren Macadam, MD  Cardiologist:  Buford Dresser, MD  Referring MD: Garner Nash, DO   CC: new patient evaluation for aortic and coronary atherosclerosis  History of Present Illness:    Tanya Barron is a 70 y.o. female with a hx of hypertension, chronic cough who is seen as a new consult at the request of I[facility], Octavio Graves, DO for the evaluation and management of atherosclerosis.  Note from 05/22/21 with Dr. Valeta Harms reviewed. Undergoing evaluation for chronic cough. CT chest noted to have aortic atherosclerosis and coronary artery calcification in all major vessels. Referred for further evaluation.  Today: Here with daughter today.   Cardiovascular risk factors: Prior clinical ASCVD: none Comorbid conditions: endorses hypertension, hyperlipidemia. Denies diabetes, chronic kidney disease Metabolic syndrome/Obesity: highest adult weight 142 lbs. Chronic inflammatory conditions: none Tobacco use history: former, quit in 1985 Family history: father had MI (mild), and then several years later had another MI that was fatal at age 54. Pat gpa died of cerebral hemorrhage. Teena Irani had adult diabetes. Mother had high cholesterol, brother has hypertension. Prior cardiac testing and/or incidental findings: CT chest with aortic and coronary artery calcifications. Years ago had an echo, told she had MVP. Exercise level: stays active. Likes to swim, has stairs in the house that she climbs several times/day. Current diet: generally healthy. Lots of vegetables, typically meat and potatoes with a vegetable for dinner. Drinks about 5-6 light beers/day  Gets rare twinge in her upper left chest. Sporadic. No clear aggravating factors. Always in the same location. Better if she rubs the area. Lasts 8-10 seconds. Non exertional.   Denies shortness of breath at rest or with normal exertion.  No PND, orthopnea, LE edema or unexpected weight gain. No syncope, rare palpitations when she coughs.   Past Medical History:  Diagnosis Date   HTN (hypertension)    Seasonal allergies     Past Surgical History:  Procedure Laterality Date   CARPAL TUNNEL RELEASE     SPINAL FUSION     TRIGGER FINGER RELEASE      Current Medications: Current Outpatient Medications on File Prior to Visit  Medication Sig   alendronate (FOSAMAX) 70 MG tablet Take 70 mg by mouth once a week.   amLODipine (NORVASC) 5 MG tablet    atorvastatin (LIPITOR) 40 MG tablet Take 1 tablet by mouth daily.   cetirizine (ZYRTEC) 10 MG tablet Take by mouth.   pantoprazole (PROTONIX) 40 MG tablet Take 1 tablet by mouth daily.   PARoxetine (PAXIL) 40 MG tablet Take 60 mg by mouth daily.   triamterene-hydrochlorothiazide (DYAZIDE) 37.5-25 MG capsule    No current facility-administered medications on file prior to visit.     Allergies:   Sulfamethoxazole-trimethoprim   Social History   Tobacco Use   Smoking status: Former    Packs/day: 1.00    Years: 12.00    Pack years: 12.00    Types: Cigarettes    Quit date: 1985    Years since quitting: 37.7   Smokeless tobacco: Never  Substance Use Topics   Alcohol use: Yes    Family History: family history includes Alzheimer's disease in her mother; Heart disease in her father.  ROS:   Please see the history of present illness.  Additional pertinent ROS: Constitutional: Negative for chills, fever, night sweats, unintentional weight loss  HENT: Negative for ear pain and  hearing loss.   Eyes: Negative for loss of vision and eye pain.  Respiratory: Negative for cough, sputum, wheezing.   Cardiovascular: See HPI. Gastrointestinal: Negative for abdominal pain, melena, and hematochezia.  Genitourinary: Negative for dysuria and hematuria.  Musculoskeletal: Negative for falls and myalgias.  Skin: Negative for itching and rash.  Neurological: Negative for focal  weakness, focal sensory changes and loss of consciousness.  Endo/Heme/Allergies: Does not bruise/bleed easily.     EKGs/Labs/Other Studies Reviewed:    The following studies were reviewed today: CT chest 05/08/21 Cardiovascular: Heart size is normal. There is no significant pericardial fluid, thickening or pericardial calcification. There is aortic atherosclerosis, as well as atherosclerosis of the great vessels of the mediastinum and the coronary arteries, including calcified atherosclerotic plaque in the left main, left anterior descending, left circumflex and right coronary arteries  EKG:  EKG is personally reviewed.   07/22/21 NSR 82 bpm.  Recent Labs: No results found for requested labs within last 8760 hours.  Recent Lipid Panel No results found for: CHOL, TRIG, HDL, CHOLHDL, VLDL, LDLCALC, LDLDIRECT  Physical Exam:    VS:  BP 112/80   Pulse 82   Ht '4\' 10"'$  (1.473 m)   Wt 136 lb 9.6 oz (62 kg)   BMI 28.55 kg/m     Wt Readings from Last 3 Encounters:  07/22/21 136 lb 9.6 oz (62 kg)  05/22/21 135 lb (61.2 kg)  04/22/21 133 lb (60.3 kg)    GEN: Well nourished, well developed in no acute distress HEENT: Normal, moist mucous membranes NECK: No JVD CARDIAC: regular rhythm, normal S1 and S2, no rubs or gallops. No murmur. VASCULAR: Radial and DP pulses 2+ bilaterally. No carotid bruits RESPIRATORY:  Clear to auscultation without rales, wheezing or rhonchi  ABDOMEN: Soft, non-tender, non-distended MUSCULOSKELETAL:  Ambulates independently SKIN: Warm and dry, no edema NEUROLOGIC:  Alert and oriented x 3. No focal neuro deficits noted. PSYCHIATRIC:  Normal affect    ASSESSMENT:    1. Coronary artery disease due to calcified coronary lesion   2. Aortic atherosclerosis ([place])   3. Essential hypertension   4. Cardiac risk counseling   5. Family history of heart disease   6. Counseling on health promotion and disease prevention    PLAN:    Coronary calcification,  consistent with calcified CAD Aortic atherosclerosis Per [facility], lipids 01/27/21 show Tchol 193, HDL 52, LDL 106, TG 209 (unknown if fasting) -given calcium, atherosclerosis, would aim for LDL <70 -she will stay on atorvastatin 40 mg daily, feels that she has made some good changes and her next lipids will be lower. We discussed increasing atorvastatin, changing to rosuvastatin, or adding ezetimibe if not at goal on recheck -discussed recommendation for aspirin in secondary prevention, she has no bleeding or GI issues, amenable to starting today -reviewed red flag warning signs that need immediate medical attention  Hypertension -well controlled, continue current medications of amlodipine and triamterene-HCTZ  Cardiac risk counseling and prevention recommendations: with a family history of heart disease -recommend heart healthy/Mediterranean diet, with whole grains, fruits, vegetable, fish, lean meats, nuts, and olive oil. Limit salt. -recommend moderate walking, 3-5 times/week for 30-50 minutes each session. Aim for at least 150 minutes.week. Goal should be pace of 3 miles/hours, or walking 1.5 miles in 30 minutes -recommend avoidance of tobacco products. Avoid excess alcohol.  Plan for follow up: 2 years or sooner as needed  Buford Dresser, MD, PhD, [facility]    Medication Adjustments/Labs  and Tests Ordered: Current medicines are reviewed at length with the patient today.  Concerns regarding medicines are outlined above.  Orders Placed This Encounter  Procedures   EKG 12-Lead    Meds ordered this encounter  Medications   aspirin EC 81 MG tablet    Sig: Take 1 tablet (81 mg total) by mouth daily. Swallow whole.    Dispense:  90 tablet    Refill:  3     Patient Instructions  Medication Instructions:  Start Aspirin 81 mg daily  *If you need a refill on your cardiac medications before your next appointment, please call your pharmacy*   Lab  Work: None ordered today   Testing/Procedures: None ordered today   Follow-Up: At [facility], you and your health needs are our priority.  As part of our continuing mission to provide you with exceptional heart care, we have created designated Provider Care Teams.  These Care Teams include your primary Cardiologist (physician) and Advanced Practice Providers (APPs -  Physician Assistants and Nurse Practitioners) who all work together to provide you with the care you need, when you need it.  We recommend signing up for the patient portal called "MyChart".  Sign up information is provided on this After Visit Summary.  MyChart is used to connect with patients for Virtual Visits (Telemedicine).  Patients are able to view lab/test results, encounter notes, upcoming appointments, etc.  Non-urgent messages can be sent to your provider as well.   To learn more about what you can do with MyChart, go to [email or web address].    Your next appointment:   2 year(s)  The format for your next appointment:   In Person  Provider:   Buford Dresser, MD    Signed, Buford Dresser, MD PhD 07/22/2021     [place]

## 2021-07-30 ENCOUNTER — Ambulatory Visit: Payer: Medicare HMO | Admitting: Psychology

## 2021-08-18 ENCOUNTER — Other Ambulatory Visit: Payer: Self-pay

## 2021-08-18 ENCOUNTER — Ambulatory Visit (INDEPENDENT_AMBULATORY_CARE_PROVIDER_SITE_OTHER): Payer: Medicare HMO | Admitting: Psychology

## 2021-08-18 DIAGNOSIS — F418 Other specified anxiety disorders: Secondary | ICD-10-CM

## 2021-08-18 DIAGNOSIS — R69 Illness, unspecified: Secondary | ICD-10-CM | POA: Diagnosis not present

## 2021-09-02 DIAGNOSIS — I10 Essential (primary) hypertension: Secondary | ICD-10-CM | POA: Diagnosis not present

## 2021-09-02 DIAGNOSIS — I700 Atherosclerosis of aorta: Secondary | ICD-10-CM | POA: Diagnosis not present

## 2021-09-02 DIAGNOSIS — F32A Depression, unspecified: Secondary | ICD-10-CM | POA: Diagnosis not present

## 2021-09-02 DIAGNOSIS — R69 Illness, unspecified: Secondary | ICD-10-CM | POA: Diagnosis not present

## 2021-09-02 DIAGNOSIS — Z23 Encounter for immunization: Secondary | ICD-10-CM | POA: Diagnosis not present

## 2021-09-02 DIAGNOSIS — F419 Anxiety disorder, unspecified: Secondary | ICD-10-CM | POA: Diagnosis not present

## 2021-09-25 DIAGNOSIS — R69 Illness, unspecified: Secondary | ICD-10-CM | POA: Diagnosis not present

## 2021-09-25 DIAGNOSIS — I10 Essential (primary) hypertension: Secondary | ICD-10-CM | POA: Diagnosis not present

## 2021-09-29 ENCOUNTER — Other Ambulatory Visit: Payer: Self-pay

## 2021-09-29 ENCOUNTER — Ambulatory Visit (INDEPENDENT_AMBULATORY_CARE_PROVIDER_SITE_OTHER): Payer: Medicare HMO | Admitting: Psychology

## 2021-09-29 DIAGNOSIS — R69 Illness, unspecified: Secondary | ICD-10-CM | POA: Diagnosis not present

## 2021-09-29 DIAGNOSIS — F418 Other specified anxiety disorders: Secondary | ICD-10-CM

## 2021-10-13 ENCOUNTER — Ambulatory Visit: Payer: Medicare HMO | Admitting: Psychology

## 2021-10-27 ENCOUNTER — Ambulatory Visit (INDEPENDENT_AMBULATORY_CARE_PROVIDER_SITE_OTHER): Payer: Medicare HMO | Admitting: Psychology

## 2021-10-27 DIAGNOSIS — R69 Illness, unspecified: Secondary | ICD-10-CM | POA: Diagnosis not present

## 2021-10-27 DIAGNOSIS — F418 Other specified anxiety disorders: Secondary | ICD-10-CM | POA: Diagnosis not present

## 2021-10-27 NOTE — Progress Notes (Signed)
[place] Counselor/Therapist Progress Note  Patient ID: Tanya Barron, MRN: [id number]    Date: 10/27/21  Time Spent: 11:01  am - 11:57 am : 56 Minutes  Treatment Type: Individual Therapy.  Reported Symptoms: Interpersonal stressors, maladaptive coping  Mental Status Exam: Appearance:  Neat and Well Groomed     Behavior: Appropriate  Motor: Normal  Speech/Language:  Clear and Coherent and Normal Rate  Affect: Appropriate  Mood: anxious  Thought process: normal  Thought content:   WNL  Sensory/Perceptual disturbances:   WNL  Orientation: oriented to person, place, time/date, and situation  Attention: Good  Concentration: Good  Memory: WNL  Fund of knowledge:  Good  Insight:   Fair  Judgment:  Good  Impulse Control: Good   Risk Assessment: Danger to Self:  No Self-injurious Behavior: No Danger to Others: No Duty to Warn:no Physical Aggression / Violence:No  Access to Firearms a concern: No  Gang Involvement: No   Subjective:   Tanya Barron participated in the session, in person in the office with the therapist, and consented to treatment. Tanya Barron reviewed the events of the past week.  She discussed a recent transition, specifically new employment, and the transition she has experienced as a result.  She noted enjoying her job overall but having some interpersonal strain with an employee which we processed during the session.  Tanya Barron discussed her continued alcohol consumption, 6 beers per night, and noted "I like it, I do not see how it is harmful".  She noted a recent discussion with her younger brother Tanya Barron who also drinks around 6 drinks a night, sharing noted the family history of significant alcohol use on the maternal side.  It is unclear if her maternal grandfather suicided at age 12 was alcohol related.  She continues to discuss her drinking and vacillates between the need to discontinue her alcohol consumption and maintaining her current volume  and frequency of consumption. We began the process Tanya Barron's vacillation during the session and therapist encouraged Tanya Barron to take inventory of her alcohol consumption in regards to the possible negative effects and perceived benefits of, between sessions, to be processed during our follow-up session.  This includes mood related concerns, health-related concerns, depression related concerns, interpersonal stress, and coping strategies.  She was agreeable to this and noted her commitment to work on this assignment between sessions.  Tanya Barron would benefit from a substance abuse assessment with a licensed substance abuse counselor which will be recommended in future sessions.  Separately, Tanya Barron provided family history regarding dynamics of parents including her mother who she highlighted as being "rigid".  She highlighted the dichotomy between her mother was rigid and her aunt who was very spirited.  She discussed her father's passing of 52 due to a heart attack and became tearful during the session noting that her father worked 3 separate jobs to support the family.  She was engaged and motivated during the session and expressed commitment towards her goals.  Therapist praised Tanya Barron for her effort and energy and willingness to complete the aforementioned assignment.  Therapist provided supportive therapy.   Interventions: Cognitive Behavioral Therapy and Interpersonal  Diagnosis:  Other specified anxiety disorders   Plan: Patient is to use CBT, mindfulness and coping skills to help manage decrease symptoms associated with their diagnosis. (Target Date: 06/30/22)   Long-term goal:   Reduce overall level, frequency, and intensity of the feelings of depression and anxiety evidenced by  decreased sadness,  feelings of guilt, worry,  negative self  talk, and helpless feelings from 6 to 7 days/week to 0 to 1 days/week per client report for at least 3 consecutive months.  Short-term goal:  Verbally express  understanding of the relationship between feelings of depression, anxiety and their impact on thinking patterns and behaviors.  Verbalize an understanding of the role that distorted thinking plays in creating fears, excessive worry, and ruminations.  Engage in enjoyable activities and reduce interpersonal stressors through communication, assertiveness, and boundary setting.   [name], LCSW

## 2021-10-30 DIAGNOSIS — R69 Illness, unspecified: Secondary | ICD-10-CM | POA: Diagnosis not present

## 2021-10-30 DIAGNOSIS — R7301 Impaired fasting glucose: Secondary | ICD-10-CM | POA: Diagnosis not present

## 2021-10-30 DIAGNOSIS — I700 Atherosclerosis of aorta: Secondary | ICD-10-CM | POA: Diagnosis not present

## 2021-10-30 DIAGNOSIS — I10 Essential (primary) hypertension: Secondary | ICD-10-CM | POA: Diagnosis not present

## 2021-10-30 DIAGNOSIS — E785 Hyperlipidemia, unspecified: Secondary | ICD-10-CM | POA: Diagnosis not present

## 2021-11-13 DIAGNOSIS — E871 Hypo-osmolality and hyponatremia: Secondary | ICD-10-CM | POA: Diagnosis not present

## 2021-11-23 DIAGNOSIS — J0100 Acute maxillary sinusitis, unspecified: Secondary | ICD-10-CM | POA: Diagnosis not present

## 2021-12-01 DIAGNOSIS — J40 Bronchitis, not specified as acute or chronic: Secondary | ICD-10-CM | POA: Diagnosis not present

## 2021-12-01 DIAGNOSIS — J329 Chronic sinusitis, unspecified: Secondary | ICD-10-CM | POA: Diagnosis not present

## 2021-12-08 ENCOUNTER — Other Ambulatory Visit: Payer: Self-pay

## 2021-12-08 ENCOUNTER — Ambulatory Visit (INDEPENDENT_AMBULATORY_CARE_PROVIDER_SITE_OTHER): Payer: Medicare HMO | Admitting: Psychology

## 2021-12-08 DIAGNOSIS — R69 Illness, unspecified: Secondary | ICD-10-CM | POA: Diagnosis not present

## 2021-12-08 DIAGNOSIS — F418 Other specified anxiety disorders: Secondary | ICD-10-CM | POA: Diagnosis not present

## 2021-12-08 NOTE — Progress Notes (Signed)
[place] Counselor/Therapist Progress Note  Patient ID: Tanya Barron, MRN: [id number]    Date: 12/08/21  Time Spent: 10:04  am - 10:51 am : 23 Minutes  Treatment Type: Individual Therapy.  Reported Symptoms: Interpersonal stressors.   Mental Status Exam: Appearance:  Neat and Well Groomed     Behavior: Appropriate  Motor: Normal  Speech/Language:  Clear and Coherent and Normal Rate  Affect: Appropriate  Mood: anxious  Thought process: normal  Thought content:   WNL  Sensory/Perceptual disturbances:   WNL  Orientation: oriented to person, place, time/date, and situation  Attention: Good  Concentration: Good  Memory: WNL  Fund of knowledge:  Good  Insight:   Fair  Judgment:  Good  Impulse Control: Good   Risk Assessment: Danger to Self:  No Self-injurious Behavior: No Danger to Others: No Duty to Warn:no Physical Aggression / Violence:No  Access to Firearms a concern: No  Gang Involvement: No   Subjective:   [name] participated in the session, in person in the office with the therapist, and consented to treatment. Keisha reviewed the events of the past week.  Trella noted feelings of hopelessness regarding her relationship with her son. She noted "obsessing" and analyzing every encounter. We roleplayed ways to manage her response to her son as she noted she does not believe she is "verbally savvy enough". She noted frustration regarding her son's treatment of her and having an audience during this time. She noted feelings of confusion and sadness. She noted her avoidance to address these concerns due to her son's behavior. We worked on identifying boundaries, going forward, with her son and explored her trepidation to set said boundaries previously. Therapist encouraged Keirstan to work on identifying boundaries between sessions and communicating them assertively and consistently. Therapist praised Dmya for her effort and energy and willingness to  complete the aforementioned assignment.  Therapist provided supportive therapy.   Interventions: Cognitive Behavioral Therapy and Interpersonal  Diagnosis:  Other specified anxiety disorders   Plan: Patient is to use CBT, mindfulness and coping skills to help manage decrease symptoms associated with their diagnosis. (Target Date: 06/30/22)   Long-term goal:   Reduce overall level, frequency, and intensity of the feelings of depression and anxiety evidenced by  decreased sadness,  feelings of guilt, worry,  negative self talk, and helpless feelings from 6 to 7 days/week to 0 to 1 days/week per client report for at least 3 consecutive months.  Short-term goal:  Verbally express understanding of the relationship between feelings of depression, anxiety and their impact on thinking patterns and behaviors.  Verbalize an understanding of the role that distorted thinking plays in creating fears, excessive worry, and ruminations.  Engage in enjoyable activities and reduce interpersonal stressors through communication, assertiveness, and boundary setting.   Address interpersonal stressors with son via assertive behavior, boundary setting, and clear communication.    [name], LCSW

## 2022-01-05 ENCOUNTER — Ambulatory Visit (INDEPENDENT_AMBULATORY_CARE_PROVIDER_SITE_OTHER): Payer: Medicare HMO | Admitting: Psychology

## 2022-01-05 DIAGNOSIS — F418 Other specified anxiety disorders: Secondary | ICD-10-CM

## 2022-01-05 DIAGNOSIS — R69 Illness, unspecified: Secondary | ICD-10-CM | POA: Diagnosis not present

## 2022-01-05 NOTE — Progress Notes (Signed)
[place] Counselor/Therapist Progress Note  Patient ID: Lisel Siegrist, MRN: [id number]    Date: 01/05/22  Time Spent: 10:05  am - 10:55 am : 50 Minutes  Treatment Type: Individual Therapy.  Reported Symptoms: Interpersonal stressors.   Mental Status Exa Appearance:  Neat and Well Groomed     Behavior: Appropriate  Motor: Normal  Speech/Language:  Clear and Coherent and Normal Rate  Affect: Appropriate  Mood: anxious  Thought process: normal  Thought content:   WNL  Sensory/Perceptual disturbances:   WNL  Orientation: oriented to person, place, time/date, and situation  Attention: Good  Concentration: Good  Memory: WNL  Fund of knowledge:  Good  Insight:   Fair  Judgment:  Good  Impulse Control: Good   Risk Assessment: Danger to Self:  No Self-injurious Behavior: No Danger to Others: No Duty to Warn:no Physical Aggression / Violence:No  Access to Firearms a concern: No  Gang Involvement: No   Subjective:   [name] participated in the session, in person in the office with the therapist, and consented to treatment. Carsen reviewed the events of the past week.Asante noted feeling gastrointestinal distress and noted her efforts to reduce her overall drinking. Kieanna noted drinking much less and feeling better, overall. She noted finding a replacement non-alcoholic drink that she has in lieu of a beer. Therapist praised Yarethzi's attempts to manage her mood and alcohol consumption. Therapist encouraged Kierston to consider attending an AA session, going forward, to provide structure and to identify barriers to attendence. Aunna noted her successful attempts at setting a boundary with her son regarding a family property that occurred 30 years ago. Therapist praised Waynesha for her effort, in this area, with her son and encouraged continued efforts along with boundary identification. Therapist encouraged Yeva to work on identifying boundaries between  sessions and communicating them assertively and consistently. Therapist praised Annarose for her effort and energy and provided supportive therapy.   Interventions: Cognitive Behavioral Therapy and Interpersonal  Diagnosis:  Other specified anxiety disorders   Plan: Patient is to use CBT, mindfulness and coping skills to help manage decrease symptoms associated with their diagnosis. (Target Date: 06/30/22)   Long-term goal:   Reduce overall level, frequency, and intensity of the feelings of depression and anxiety evidenced by  decreased sadness,  feelings of guilt, worry,  negative self talk, and helpless feelings from 6 to 7 days/week to 0 to 1 days/week per client report for at least 3 consecutive months.  Short-term goal:  Verbally express understanding of the relationship between feelings of depression, anxiety and their impact on thinking patterns and behaviors.  Verbalize an understanding of the role that distorted thinking plays in creating fears, excessive worry, and ruminations.  Engage in enjoyable activities and reduce interpersonal stressors through communication, assertiveness, and boundary setting.   Address interpersonal stressors with son via assertive behavior, boundary setting, and clear communication.    [name], LCSW

## 2022-01-06 DIAGNOSIS — J302 Other seasonal allergic rhinitis: Secondary | ICD-10-CM | POA: Diagnosis not present

## 2022-01-06 DIAGNOSIS — J019 Acute sinusitis, unspecified: Secondary | ICD-10-CM | POA: Diagnosis not present

## 2022-01-19 ENCOUNTER — Ambulatory Visit (INDEPENDENT_AMBULATORY_CARE_PROVIDER_SITE_OTHER): Payer: Medicare HMO | Admitting: Psychology

## 2022-01-19 ENCOUNTER — Other Ambulatory Visit: Payer: Self-pay

## 2022-01-19 DIAGNOSIS — R69 Illness, unspecified: Secondary | ICD-10-CM | POA: Diagnosis not present

## 2022-01-19 DIAGNOSIS — F418 Other specified anxiety disorders: Secondary | ICD-10-CM

## 2022-01-19 DIAGNOSIS — H5213 Myopia, bilateral: Secondary | ICD-10-CM | POA: Diagnosis not present

## 2022-01-19 NOTE — Progress Notes (Signed)
[place] Counselor/Therapist Progress Note ? ?Patient ID: Tanya Barron, MRN: [id number]   ? ?Date: 01/19/22 ? ?Time Spent: 09:05  am - 9:50 am : 45 Minutes ? ?Treatment Type: Individual Therapy. ? ?Reported Symptoms: Interpersonal stressors.  ? ?Mental Status Exa ?Appearance:  Neat and Well Groomed     ?Behavior: Appropriate  ?Motor: Normal  ?Speech/Language:  Clear and Coherent and Normal Rate  ?Affect: Appropriate  ?Mood: anxious  ?Thought process: normal  ?Thought content:   WNL  ?Sensory/Perceptual disturbances:   WNL  ?Orientation: oriented to person, place, time/date, and situation  ?Attention: Good  ?Concentration: Good  ?Memory: WNL  ?Fund of knowledge:  Good  ?Insight:   Fair  ?Judgment:  Good  ?Impulse Control: Good  ? ?Risk Assessment: ?Danger to Self:  No ?Self-injurious Behavior: No ?Danger to Others: No ?Duty to Warn:no ?Physical Aggression / Violence:No  ?Access to Firearms a concern: No  ?Gang Involvement: No  ? ?Subjective:  ? ?Tanya Barron participated in the session, in person in the office with the therapist, and consented to treatment. Tanya Barron reviewed the events of the past week.  Tanya Barron discussed stressors at home regarding her daughter's refusal to gain employment.  She noted contacting vocational rehab to aid in this process of identifying a job for her daughter.  She noted continued frustration with her son's behavior, which he is directed towards all family members, at this point.  She discussed low motivation specifically in regards to household tasks and socializing.  She appears to be in the contemplative stage of change in regards to attending her first Tanya Barron meeting.  She appears to vacillate in the space but would clearly benefit from attending a meeting to receive resources and support as she works on managing her alcohol consumption she noted having a friend, Tanya Barron, who is in [place] and was offered to take her with her therapist encouraged Tanya Barron to contact her  friend and to work on asking questions as a way to manage her anxiety regarding this possible process.  Tanya Barron committed to engaging in enjoyable activities and contacting her friend Tanya Barron prior to our follow-up meeting.  We discussed the importance of follow through on goals but only committing to the goal itself at this time, and setting additional goals as time passes.  She noted a barrier to her engagement, and any behavior, including her mother's perfectionism and expectation of doing everything to the highest level.  We will process this going forward.  Therapist validated and normalized Tanya Barron's healthy feelings cognitions, provided psychoeducation regarding depression and barriers to engagement, and provided supportive therapy.  Follow-up was scheduled.  Tanya Barron would continue to benefit from consistent therapy. ? ?Interventions: Cognitive Behavioral Therapy and Interpersonal ? ?Diagnosis:  Other specified anxiety disorders ? ? ?Plan: Patient is to use CBT, mindfulness and coping skills to help manage decrease symptoms associated with their diagnosis. (Target Date: 06/30/22) ?  ?Long-term goal:   ?Reduce overall level, frequency, and intensity of the feelings of depression and anxiety evidenced by  decreased sadness,  feelings of guilt, worry,  negative self talk, and helpless feelings from 6 to 7 days/week to 0 to 1 days/week per client report for at least 3 consecutive months. ? ?Short-term goal:  ?Verbally express understanding of the relationship between feelings of depression, anxiety and their impact on thinking patterns and behaviors. ? ?Verbalize an understanding of the role that distorted thinking plays in creating fears, excessive worry, and ruminations. ? ?Engage in enjoyable activities and reduce interpersonal  stressors through communication, assertiveness, and boundary setting.  ? ?Address interpersonal stressors with son via assertive behavior, boundary setting, and clear communication.   ? ? ?Tanya Irish, LCSW ?

## 2022-02-02 DIAGNOSIS — Z1159 Encounter for screening for other viral diseases: Secondary | ICD-10-CM | POA: Diagnosis not present

## 2022-02-02 DIAGNOSIS — M8580 Other specified disorders of bone density and structure, unspecified site: Secondary | ICD-10-CM | POA: Diagnosis not present

## 2022-02-02 DIAGNOSIS — R053 Chronic cough: Secondary | ICD-10-CM | POA: Diagnosis not present

## 2022-02-02 DIAGNOSIS — Z79899 Other long term (current) drug therapy: Secondary | ICD-10-CM | POA: Diagnosis not present

## 2022-02-02 DIAGNOSIS — E559 Vitamin D deficiency, unspecified: Secondary | ICD-10-CM | POA: Diagnosis not present

## 2022-02-02 DIAGNOSIS — I10 Essential (primary) hypertension: Secondary | ICD-10-CM | POA: Diagnosis not present

## 2022-02-02 DIAGNOSIS — R7303 Prediabetes: Secondary | ICD-10-CM | POA: Diagnosis not present

## 2022-02-02 DIAGNOSIS — Z124 Encounter for screening for malignant neoplasm of cervix: Secondary | ICD-10-CM | POA: Diagnosis not present

## 2022-02-02 DIAGNOSIS — Z0000 Encounter for general adult medical examination without abnormal findings: Secondary | ICD-10-CM | POA: Diagnosis not present

## 2022-02-02 DIAGNOSIS — E785 Hyperlipidemia, unspecified: Secondary | ICD-10-CM | POA: Diagnosis not present

## 2022-02-02 DIAGNOSIS — I700 Atherosclerosis of aorta: Secondary | ICD-10-CM | POA: Diagnosis not present

## 2022-02-03 ENCOUNTER — Other Ambulatory Visit: Payer: Self-pay | Admitting: Family Medicine

## 2022-02-03 DIAGNOSIS — M8580 Other specified disorders of bone density and structure, unspecified site: Secondary | ICD-10-CM

## 2022-02-16 ENCOUNTER — Ambulatory Visit (INDEPENDENT_AMBULATORY_CARE_PROVIDER_SITE_OTHER): Payer: Medicare HMO | Admitting: Psychology

## 2022-02-16 DIAGNOSIS — R69 Illness, unspecified: Secondary | ICD-10-CM | POA: Diagnosis not present

## 2022-02-16 DIAGNOSIS — F418 Other specified anxiety disorders: Secondary | ICD-10-CM

## 2022-02-16 NOTE — Progress Notes (Signed)
[facility] Counselor/Therapist Progress Note ? ?Patient ID: Tanya Barron, MRN: [id number]   ? ?Date: 02/16/22 ? ?Time Spent: 09:05  am - 10:05 am : 60 Minutes ? ?Treatment Type: Individual Therapy. ? ?Reported Symptoms: Interpersonal stressors.  ? ?Mental Status Exa ?Appearance:  Neat and Well Groomed     ?Behavior: Appropriate  ?Motor: Normal  ?Speech/Language:  Clear and Coherent and Normal Rate  ?Affect: Appropriate  ?Mood: normal  ?Thought process: normal  ?Thought content:   WNL  ?Sensory/Perceptual disturbances:   WNL  ?Orientation: oriented to person, place, time/date, and situation  ?Attention: Good  ?Concentration: Good  ?Memory: WNL  ?Fund of knowledge:  Good  ?Insight:   Fair  ?Judgment:  Good  ?Impulse Control: Good  ? ?Risk Assessment: ?Danger to Self:  No ?Self-injurious Behavior: No ?Danger to Others: No ?Duty to Warn:no ?Physical Aggression / Violence:No  ?Access to Firearms a concern: No  ?Gang Involvement: No  ? ?Subjective:  ? ?Tanya Barron participated in the session, in person in the office with the therapist, and consented to treatment. Tanya Barron reviewed the events of the past week. Tanya Barron noted not "having a handle on what it is I am searching for". We explored this during the session including her interest in engaging in coloring/art. She noted feeling disconnected from others. We explored this during the session and the barriers to engagement. Therapist employred BA principles to identify barriers and engage in problem-solving to address concerns. She noted difficulty with beginning and completing tasks. She discussed possible ADHD symptoms and noted a family history (daughter). She endorsed symptoms of ADHD prior to age 63 and noted being caled a "grasshopper" due to her hyperactivity, at school. We completed the ASRS V1.1  during the session. Please see below for answers to screening results.  In-depth psychoeducation was provided regarding ADHD and adult presentation of.   Therapist encouraged Tanya Barron to engage in reading regarding adult ADHD and numerous book recommendations were provided during the session.  Tanya Barron had a positive screening for ADHD and is provided with numerous options for additional more in depth testing including psychiatry, a psychological assessment, or meeting with an ADHD specialist in the area.  Tanya Barron worked on processing her options and providing feedback during our follow-up session.  Tanya Barron was engaged and motivated during the session and she expressed commitment towards her goals.  Therapist praised Tanya Barron for her effort and energy and provided supportive therapy. ? ? ?ASRS V1.1 ? ?Part-A ?1. How often do you have trouble wrapping up the final details of a project, ?    once the challenging parts have been done? Often ?2. How often do you have difficulty getting things in order when you have to do ?    a task that requires organization? Often ?3. How often do you have problems remembering appointments or obligations? Rarely ?4. When you have a task that requires a lot of thought, how often do you avoid ?    or delay getting started? Often ?5. How often do you fidget or squirm with your hands or feet when you have ?    to sit down for a long time? Often ?6. How often do you feel overly active and compelled to do things, like you ?    were driven by a motor? Sometimes ? ?Part-B ?7. How often do you make careless mistakes when you have to work on a boring or ?    difficult project? Often ?8. How often do you have difficulty  keeping your attention when you are doing boring ?    or repetitive work? Often ?9. How often do you have difficulty concentrating on what people say to you, ?     even when they are speaking to you directly? Rarely ?10. How often do you misplace or have difficulty finding things at home or at work? Often ?11. How often are you distracted by activity or noise around you? Rarely ?12. How often do you leave your seat in meetings or other  situations in which ?      you are expected to remain seated? Sometimes ?13. How often do you feel restless or fidgety? Sometimes ?14. How often do you have difficulty unwinding and relaxing when you have time ?      to yourself? Sometimes ?15. How often do you find yourself talking too much when you are in social situations? Rarely ?16. When you?re in a conversation, how often do you find yourself finishing     ?      the sentences of the people you are talking to, before they can finish ?      them themselves? Rarely ?17. How often do you have difficulty waiting your turn in situations when ?      turn taking is required? Rarely ?18. How often do you interrupt others when they are busy? Sometimes ? ?Interventions: Psycho-education/Bibliotherapy ? ?Diagnosis:  Other specified anxiety disorders ? ? ?Plan: Patient is to use CBT, mindfulness and coping skills to help manage decrease symptoms associated with their diagnosis. (Target Date: 06/30/22) ?  ?Long-term goal:   ?Reduce overall level, frequency, and intensity of the feelings of depression and anxiety evidenced by  decreased sadness,  feelings of guilt, worry,  negative self talk, and helpless feelings from 6 to 7 days/week to 0 to 1 days/week per client report for at least 3 consecutive months. ? ?Short-term goal:  ?Verbally express understanding of the relationship between feelings of depression, anxiety and their impact on thinking patterns and behaviors. ? ?Verbalize an understanding of the role that distorted thinking plays in creating fears, excessive worry, and ruminations. ? ?Engage in enjoyable activities and reduce interpersonal stressors through communication, assertiveness, and boundary setting.  ? ?Address interpersonal stressors with son via assertive behavior, boundary setting, and clear communication.  ? ? ?[name], LCSW ?

## 2022-02-19 ENCOUNTER — Other Ambulatory Visit: Payer: Self-pay | Admitting: Family Medicine

## 2022-02-19 DIAGNOSIS — Z1231 Encounter for screening mammogram for malignant neoplasm of breast: Secondary | ICD-10-CM

## 2022-03-02 ENCOUNTER — Ambulatory Visit (INDEPENDENT_AMBULATORY_CARE_PROVIDER_SITE_OTHER): Payer: Medicare HMO | Admitting: Psychology

## 2022-03-02 DIAGNOSIS — F418 Other specified anxiety disorders: Secondary | ICD-10-CM | POA: Diagnosis not present

## 2022-03-02 DIAGNOSIS — R69 Illness, unspecified: Secondary | ICD-10-CM | POA: Diagnosis not present

## 2022-03-02 NOTE — Progress Notes (Signed)
[facility] Counselor/Therapist Progress Note ? ?Patient ID: Tanya Barron, MRN: [id number]   ? ?Date: 03/02/22 ? ?Time Spent: 09:06  am -9:55 am : 49 Minutes ? ?Treatment Type: Individual Therapy. ? ?Reported Symptoms: Interpersonal stressors.  ? ?Mental Status Exa ?Appearance:  Neat and Well Groomed     ?Behavior: Appropriate  ?Motor: Normal  ?Speech/Language:  Clear and Coherent and Normal Rate  ?Affect: Appropriate  ?Mood: normal  ?Thought process: normal  ?Thought content:   WNL  ?Sensory/Perceptual disturbances:   WNL  ?Orientation: oriented to person, place, time/date, and situation  ?Attention: Good  ?Concentration: Good  ?Memory: WNL  ?Fund of knowledge:  Good  ?Insight:   Fair  ?Judgment:  Good  ?Impulse Control: Good  ? ?Risk Assessment: ?Danger to Self:  No ?Self-injurious Behavior: No ?Danger to Others: No ?Duty to Warn:no ?Physical Aggression / Violence:No  ?Access to Firearms a concern: No  ?Gang Involvement: No  ? ?Subjective:  ? ?Tanya Barron participated in the session, in person in the office with the therapist, and consented to treatment. Tanya Barron reviewed the events of the past week. Tanya Barron noted her efforts to procure a book recommendation regarding ADHD. She noted her anxiety regarding her son's feedback regarding her recent car purchase and her hopes but doubts that he will be supportive. She noted her stress regarding her cat and the cat's behavior. She discussed her worry regarding her daughter's long-term care  as she, Tanya Barron, ages. She noted her difficulty with aging and negative feedback from her kids. We explored this during the session and ways to communicate boundaries going forward. Therapist modeled assertive and positive communication during the session.  Tanya Barron was engaged and motivated during the session and she expressed commitment towards her goals of setting boundaries and communicating needs proactively.  Therapist praised Tanya Barron for her effort and energy and  provided supportive therapy. ? ?Interventions: Interpersonal ? ?Diagnosis:  Other specified anxiety disorders ? ? ?Plan: Patient is to use CBT, mindfulness and coping skills to help manage decrease symptoms associated with their diagnosis. (Target Date: 06/30/22) ?  ?Long-term goal:   ?Reduce overall level, frequency, and intensity of the feelings of depression and anxiety evidenced by  decreased sadness,  feelings of guilt, worry,  negative self talk, and helpless feelings from 6 to 7 days/week to 0 to 1 days/week per client report for at least 3 consecutive months. ? ?Short-term goal:  ?Verbally express understanding of the relationship between feelings of depression, anxiety and their impact on thinking patterns and behaviors. ? ?Verbalize an understanding of the role that distorted thinking plays in creating fears, excessive worry, and ruminations. ? ?Engage in enjoyable activities and reduce interpersonal stressors through communication, assertiveness, and boundary setting.  ? ?Address interpersonal stressors with son via assertive behavior, boundary setting, and clear communication.  ? ? ?[name], LCSW ?

## 2022-03-16 ENCOUNTER — Ambulatory Visit: Payer: Medicare HMO | Admitting: Psychology

## 2022-03-30 ENCOUNTER — Ambulatory Visit (INDEPENDENT_AMBULATORY_CARE_PROVIDER_SITE_OTHER): Payer: Medicare HMO | Admitting: Psychology

## 2022-03-30 DIAGNOSIS — F418 Other specified anxiety disorders: Secondary | ICD-10-CM

## 2022-03-30 DIAGNOSIS — R69 Illness, unspecified: Secondary | ICD-10-CM | POA: Diagnosis not present

## 2022-03-30 NOTE — Progress Notes (Signed)
[facility] Counselor/Therapist Progress Note ? ?Patient ID: Tanya Barron, MRN: [id number]   ? ?Date: 03/30/22 ? ?Time Spent: 09:06  am -9:53 am : 47 Minutes ? ?Treatment Type: Individual Therapy. ? ?Reported Symptoms: Interpersonal stressors.  ? ?Mental Status Exa ?Appearance:  Neat and Well Groomed     ?Behavior: Appropriate  ?Motor: Normal  ?Speech/Language:  Clear and Coherent and Normal Rate  ?Affect: Flat  ?Mood: dysthymic  ?Thought process: normal  ?Thought content:   WNL  ?Sensory/Perceptual disturbances:   WNL  ?Orientation: oriented to person, place, time/date, and situation  ?Attention: Good  ?Concentration: Good  ?Memory: WNL  ?Fund of knowledge:  Good  ?Insight:   Fair  ?Judgment:  Good  ?Impulse Control: Good  ? ?Risk Assessment: ?Danger to Self:  No ?Self-injurious Behavior: No ?Danger to Others: No ?Duty to Warn:no ?Physical Aggression / Violence:No  ?Access to Firearms a concern: No  ?Gang Involvement: No  ? ?Subjective:  ? ?Tanya Barron participated in the session, in person in the office with the therapist, and consented to treatment. Tanya Barron reviewed the events of the past week.She noted her interest in going to [place] meeting with her friend, for the first time. She noted an increase in her drinking  (15 pack every 3 days), which results in fluctuation in her finances. She noted her financial struggles due to the cost of living. She noted this affecting her sleep due to rumination. History of struggling with overeating as a child. She discussed her interest in ADHD testing.  She endorsed additional symptoms including difficulty sitting still, needing double stimulation, difficulty engaging in unenjoyable tasks and needing additional stimulation, having difficulty staying organized, living in perpetual clutter. She endorsed childhood symptoms of ADHD, as well. Resources were provided via email for local psychiatrists. We discussed numerous treatment options during the session.  Therapist praised Tanya Barron for her flexibility in this area. Therapist praised Tanya Barron for her effort and energy and provided supportive therapy. ? ?Interventions: Psycho-education/Bibliotherapy  ? ?Diagnosis:  Other specified anxiety disorders ? ? ?Plan: Patient is to use CBT, mindfulness and coping skills to help manage decrease symptoms associated with their diagnosis. (Target Date: 06/30/22) ?  ?Long-term goal:   ?Reduce overall level, frequency, and intensity of the feelings of depression and anxiety evidenced by  decreased sadness,  feelings of guilt, worry,  negative self talk, and helpless feelings from 6 to 7 days/week to 0 to 1 days/week per client report for at least 3 consecutive months. ? ?Short-term goal:  ?Verbally express understanding of the relationship between feelings of depression, anxiety and their impact on thinking patterns and behaviors. ? ?Verbalize an understanding of the role that distorted thinking plays in creating fears, excessive worry, and ruminations. ? ?Engage in enjoyable activities and reduce interpersonal stressors through communication, assertiveness, and boundary setting.  ? ?Address interpersonal stressors with son via assertive behavior, boundary setting, and clear communication.  ? ? ?[name], LCSW ?

## 2022-04-13 ENCOUNTER — Ambulatory Visit (INDEPENDENT_AMBULATORY_CARE_PROVIDER_SITE_OTHER): Payer: Medicare HMO | Admitting: Psychology

## 2022-04-13 DIAGNOSIS — R69 Illness, unspecified: Secondary | ICD-10-CM | POA: Diagnosis not present

## 2022-04-13 DIAGNOSIS — F418 Other specified anxiety disorders: Secondary | ICD-10-CM

## 2022-04-13 NOTE — Progress Notes (Signed)
[facility] Counselor/Therapist Progress Note  Patient ID: Tanya Barron, MRN: [id number]    Date: 04/13/22  Time Spent: 09:03  am - 9:53 am : 50 Minutes  Treatment Type: Individual Therapy.  Reported Symptoms: Interpersonal stressors.   Mental Status Exa Appearance:  Neat and Well Groomed     Behavior: Appropriate  Motor: Normal  Speech/Language:  Clear and Coherent and Normal Rate  Affect: Flat  Mood: dysthymic  Thought process: normal  Thought content:   WNL  Sensory/Perceptual disturbances:   WNL  Orientation: oriented to person, place, time/date, and situation  Attention: Good  Concentration: Good  Memory: WNL  Fund of knowledge:  Good  Insight:   Fair  Judgment:  Good  Impulse Control: Good   Risk Assessment: Danger to Self:  No Self-injurious Behavior: No Danger to Others: No Duty to Warn:no Physical Aggression / Violence:No  Access to Firearms a concern: No  Gang Involvement: No   Subjective:   Tanya Barron participated in the session, in person in the office with the therapist, and consented to treatment. Tanya Barron reviewed the events of the past week. She noted her son's interest in having a "conversation" but noted her worry that this conversation will turn negative. She noted anxiety regarding this conversation. She noted having to "defend" herself from her son, Tanya Barron. We explored her worries and discussed ways to address her concerns and note therapist modeled assertive and positive communication. She noted her upcoming intent to attend an Tanya Barron meeting with friend Tanya Barron. She noted being involved with the Tanya Barron board and having an overall positive experience. She noted not drinking  yesterday and noted it feeling "nice". She noted this positively affecting her sleep. She noted this "always feeling good".  She noted not "needing it". She noted often restarting her drinking, after stopping, often pairs drinking with alcohol to make activities more  palatable. Alternatively, she would drink and avoid the task. She noted her previous attempts to discontinue her alcohol consumption. This included quitting for 7 weeks after back surgery. We continued to explore the effects of alcohol on her mood and day-to-day life. Therapist praised Tanya Barron for her effort and energy during the session. Therapist validated Tanya Barron's experience and provided supportive therapy.   Interventions: Interpersonal   Diagnosis:  Other specified anxiety disorders   Plan: Patient is to use CBT, mindfulness and coping skills to help manage decrease symptoms associated with their diagnosis. (Target Date: 06/30/22)   Long-term goal:   Reduce overall level, frequency, and intensity of the feelings of depression and anxiety evidenced by  decreased sadness,  feelings of guilt, worry,  negative self talk, and helpless feelings from 6 to 7 days/week to 0 to 1 days/week per client report for at least 3 consecutive months.  Short-term goal:  Verbally express understanding of the relationship between feelings of depression, anxiety and their impact on thinking patterns and behaviors.  Verbalize an understanding of the role that distorted thinking plays in creating fears, excessive worry, and ruminations.  Engage in enjoyable activities and reduce interpersonal stressors through communication, assertiveness, and boundary setting.   Address interpersonal stressors with son via assertive behavior, boundary setting, and clear communication.    [name], LCSW

## 2022-05-04 ENCOUNTER — Ambulatory Visit (INDEPENDENT_AMBULATORY_CARE_PROVIDER_SITE_OTHER): Payer: Medicare HMO | Admitting: Psychology

## 2022-05-04 DIAGNOSIS — F418 Other specified anxiety disorders: Secondary | ICD-10-CM

## 2022-05-04 DIAGNOSIS — R69 Illness, unspecified: Secondary | ICD-10-CM | POA: Diagnosis not present

## 2022-05-04 NOTE — Progress Notes (Signed)
[facility] Counselor/Therapist Progress Note  Patient ID: Tanya Barron, MRN: [id number]    Date: 05/04/22  Time Spent: 12:03  pm - 12:49 pm : 46 Minutes  Treatment Type: Individual Therapy.  Reported Symptoms: Interpersonal stressors.   Mental Status Exa Appearance:  Neat and Well Groomed     Behavior: Appropriate  Motor: Normal  Speech/Language:  Clear and Coherent and Normal Rate  Affect: Flat  Mood: dysthymic  Thought process: normal  Thought content:   WNL  Sensory/Perceptual disturbances:   WNL  Orientation: oriented to person, place, time/date, and situation  Attention: Good  Concentration: Good  Memory: WNL  Fund of knowledge:  Good  Insight:   Fair  Judgment:  Good  Impulse Control: Good   Risk Assessment: Danger to Self:  No Self-injurious Behavior: No Danger to Others: No Duty to Warn:no Physical Aggression / Violence:No  Access to Firearms a concern: No  Gang Involvement: No   Subjective:   Tanya Barron participated in the session, in person in the office with the therapist, and consented to treatment. Tanya Barron reviewed the events of the past week. Tanya Barron noted a recent off of her daughter in-law's brother. She noted her attempts to address her debt via a debt relief agency but noted canceled with the agency due to feeling discomfort. She selected a different agency she felt more comfortable with. She has an upcoming initial psychiatric evaluation in mid-July. She noted discussing with her friend, Tanya Barron, to attend an [place] meeting with her. She noted identifying meetings to attend but has not engaged yet. She noted her drinking being "on and off" in the past week. She noted a recent argument with her son and noted frustration regarding his approach. We explored this during the session. We reviewed boundary setting during the session. Therapist praised Tanya Barron for her effort and energy during the session. Therapist validated Tanya Barron's experience and  provided supportive therapy. We set a goal for attending an [place] meeting between sessions. Tanya Barron committed to the goal between sessions.   Interventions: Interpersonal   Diagnosis:  Other specified anxiety disorders   Plan: Patient is to use CBT, mindfulness and coping skills to help manage decrease symptoms associated with their diagnosis. (Target Date: 06/30/22)   Long-term goal:   Reduce overall level, frequency, and intensity of the feelings of depression and anxiety evidenced by  decreased sadness,  feelings of guilt, worry,  negative self talk, and helpless feelings from 6 to 7 days/week to 0 to 1 days/week per client report for at least 3 consecutive months.  Short-term goal:  Verbally express understanding of the relationship between feelings of depression, anxiety and their impact on thinking patterns and behaviors.  Verbalize an understanding of the role that distorted thinking plays in creating fears, excessive worry, and ruminations.  Engage in enjoyable activities and reduce interpersonal stressors through communication, assertiveness, and boundary setting.   Address interpersonal stressors with son via assertive behavior, boundary setting, and clear communication.    [name], LCSW

## 2022-05-24 ENCOUNTER — Ambulatory Visit (INDEPENDENT_AMBULATORY_CARE_PROVIDER_SITE_OTHER): Payer: Medicare HMO | Admitting: Psychology

## 2022-05-24 DIAGNOSIS — F418 Other specified anxiety disorders: Secondary | ICD-10-CM

## 2022-05-24 DIAGNOSIS — F331 Major depressive disorder, recurrent, moderate: Secondary | ICD-10-CM | POA: Diagnosis not present

## 2022-05-24 DIAGNOSIS — R69 Illness, unspecified: Secondary | ICD-10-CM | POA: Diagnosis not present

## 2022-05-24 DIAGNOSIS — F411 Generalized anxiety disorder: Secondary | ICD-10-CM | POA: Diagnosis not present

## 2022-05-24 NOTE — Progress Notes (Signed)
[facility] Counselor/Therapist Progress Note  Patient ID: Tanya Barron, MRN: [id number]    Date: 05/24/22  Time Spent: 9:03  am - 9:50 am : 47 Minutes  Treatment Type: Individual Therapy.  Reported Symptoms: Interpersonal stressors.   Mental Status Exa Appearance:  Neat and Well Groomed     Behavior: Appropriate  Motor: Normal  Speech/Language:  Clear and Coherent and Normal Rate  Affect: Flat  Mood: dysthymic  Thought process: normal  Thought content:   WNL  Sensory/Perceptual disturbances:   WNL  Orientation: oriented to person, place, time/date, and situation  Attention: Good  Concentration: Good  Memory: WNL  Fund of knowledge:  Good  Insight:   Fair  Judgment:  Good  Impulse Control: Good   Risk Assessment: Danger to Self:  No Self-injurious Behavior: No Danger to Others: No Duty to Warn:no Physical Aggression / Violence:No  Access to Firearms a concern: No  Gang Involvement: No   Subjective:   Tanya Barron participated in the session, in person in the office with the therapist, and consented to treatment. Tanya Barron reviewed the events of the past week. She noted her son's brother in-law's Tanya Barron) unexpected passing at a young age and the effect of this on her and the family, as a whole. She has not made it to an Tanya Barron meeting since our last appointment but noted this passing and the following proceedings affecting her ability to complete the goals between sessions. She has an appointment, today, with [facility] for a psychiatric consult. She noted feelings of excitement and dread, regarding the appointment, which we explored during the session. She noted lethargy and the familial loss being barriers to task completion including cleaning the home. We explored the possible sources of her lethargy and she noted needing motivation and purpose. She noted being "focused" on her daughter for the past 35 years and not focusing on self. She  noted her daughter's general objection to Tanya Barron dating and often being upset with this. We explored Tanya Barron's stressors including her difficulty getting her daughter to engage outside of the home and the effect of this on Tanya Barron, her daughter, as well as Tanya Barron. We explored ways to manage this, going forward, via problem solving and assertive communication. Therapist praised Tanya Barron for her effort and energy during the session.  We set a goal for attending an [place] meeting between sessions, which Tanya Barron committed to.   Interventions: Interpersonal   Diagnosis:  Other specified anxiety disorders   Plan: Patient is to use CBT, mindfulness and coping skills to help manage decrease symptoms associated with their diagnosis. (Target Date: 06/30/22)   Long-term goal:   Reduce overall level, frequency, and intensity of the feelings of depression and anxiety evidenced by  decreased sadness,  feelings of guilt, worry,  negative self talk, and helpless feelings from 6 to 7 days/week to 0 to 1 days/week per client report for at least 3 consecutive months.  Short-term goal:  Verbally express understanding of the relationship between feelings of depression, anxiety and their impact on thinking patterns and behaviors.  Verbalize an understanding of the role that distorted thinking plays in creating fears, excessive worry, and ruminations.  Engage in enjoyable activities and reduce interpersonal stressors through communication, assertiveness, and boundary setting.   Address interpersonal stressors with son via assertive behavior, boundary setting, and clear communication.    Tanya Irish, LCSW

## 2022-06-07 ENCOUNTER — Ambulatory Visit (INDEPENDENT_AMBULATORY_CARE_PROVIDER_SITE_OTHER): Payer: Medicare HMO | Admitting: Psychology

## 2022-06-07 DIAGNOSIS — R69 Illness, unspecified: Secondary | ICD-10-CM | POA: Diagnosis not present

## 2022-06-07 DIAGNOSIS — F418 Other specified anxiety disorders: Secondary | ICD-10-CM

## 2022-06-07 NOTE — Progress Notes (Signed)
[place] Counselor/Therapist Progress Note  Patient ID: Tanya Barron, MRN: [id number]    Date: 06/07/22  Time Spent: 10:07  am - 10:55 am : 45 Minutes  Treatment Type: Individual Therapy.  Reported Symptoms: Interpersonal stressors.   Mental Status Exa Appearance:  Neat and Well Groomed     Behavior: Appropriate  Motor: Normal  Speech/Language:  Clear and Coherent and Normal Rate  Affect: Flat  Mood: dysthymic  Thought process: normal  Thought content:   WNL  Sensory/Perceptual disturbances:   WNL  Orientation: oriented to person, place, time/date, and situation  Attention: Good  Concentration: Good  Memory: WNL  Fund of knowledge:  Good  Insight:   Fair  Judgment:  Good  Impulse Control: Good   Risk Assessment: Danger to Self:  No Self-injurious Behavior: No Danger to Others: No Duty to Warn:no Physical Aggression / Violence:No  Access to Firearms a concern: No  Gang Involvement: No   Subjective:   Tanya Barron participated in the session, in person in the office with the therapist, and consented to treatment. Tanya Barron reviewed the events of the past week. Tanya Barron noted meeting [facility]. She was diagnosed with Anxiety and prescribed Gabapentin BID but could not recall the dosage. She discussed some concerns about the medication, which we explored during the session. She noted her provider's willingness to investigate the ADHD symptoms at a later date. She noted working on engaging in Engineer, site, Training and development officer, specifically. Tanya Barron noted an increased drive to engage in more Architect. She noted an effort to be more fulfilled and in need and recently applied for a tutoring job. Tanya Barron noted not attending an [place] meeting. She noted a need to be more committed before attending. We explored this during the session. Therapist challenged this via evidence and discussed the effect of mood on self-talk. Tanya Barron was  engaged during the session. Therapist praised Tanya Barron for her effort and energy during the session.  We set a goal for attending an [place] meeting between session, which Tanya Barron committed to during the session. .   Interventions:  BA & CBT    Diagnosis:  Other specified anxiety disorders   Plan: Patient is to use CBT, mindfulness and coping skills to help manage decrease symptoms associated with their diagnosis. (Target Date: 06/30/22)   Long-term goal:   Reduce overall level, frequency, and intensity of the feelings of depression and anxiety evidenced by  decreased sadness,  feelings of guilt, worry,  negative self talk, and helpless feelings from 6 to 7 days/week to 0 to 1 days/week per client report for at least 3 consecutive months.  Short-term goal:  Verbally express understanding of the relationship between feelings of depression, anxiety and their impact on thinking patterns and behaviors.  Verbalize an understanding of the role that distorted thinking plays in creating fears, excessive worry, and ruminations.  Engage in enjoyable activities and reduce interpersonal stressors through communication, assertiveness, and boundary setting.   Address interpersonal stressors with son via assertive behavior, boundary setting, and clear communication.    Tanya Irish, LCSW

## 2022-06-21 DIAGNOSIS — R69 Illness, unspecified: Secondary | ICD-10-CM | POA: Diagnosis not present

## 2022-06-21 DIAGNOSIS — F331 Major depressive disorder, recurrent, moderate: Secondary | ICD-10-CM | POA: Diagnosis not present

## 2022-06-21 DIAGNOSIS — F411 Generalized anxiety disorder: Secondary | ICD-10-CM | POA: Diagnosis not present

## 2022-07-05 ENCOUNTER — Ambulatory Visit (INDEPENDENT_AMBULATORY_CARE_PROVIDER_SITE_OTHER): Payer: Medicare HMO | Admitting: Psychology

## 2022-07-05 DIAGNOSIS — R69 Illness, unspecified: Secondary | ICD-10-CM | POA: Diagnosis not present

## 2022-07-05 DIAGNOSIS — F418 Other specified anxiety disorders: Secondary | ICD-10-CM | POA: Diagnosis not present

## 2022-07-05 NOTE — Progress Notes (Signed)
[place] Counselor/Therapist Progress Note  Patient ID: Elaynah Virginia, MRN: [id number]    Date: 07/05/22  Time Spent: 10:04  am - 10:57 am : 53 Minutes  Treatment Type: Individual Therapy.  Reported Symptoms: Interpersonal stressors.   Mental Status Exa Appearance:  Neat and Well Groomed     Behavior: Appropriate  Motor: Normal  Speech/Language:  Clear and Coherent and Normal Rate  Affect: Congruent  Mood: normal  Thought process: normal  Thought content:   WNL  Sensory/Perceptual disturbances:   WNL  Orientation: oriented to person, place, time/date, and situation  Attention: Good  Concentration: Good  Memory: WNL  Fund of knowledge:  Good  Insight:   Fair  Judgment:  Good  Impulse Control: Good   Risk Assessment: Danger to Self:  No Self-injurious Behavior: No Danger to Others: No Duty to Warn:no Physical Aggression / Violence:No  Access to Firearms a concern: No  Gang Involvement: No   Subjective:   [name] participated in the session, in person in the office with the therapist, and consented to treatment. Kuulei reviewed the events of the past week. [name] noted attending her first [place] meeting with her friend, [name]. She found the experience to be "nice" and "inclusive". We reflected upon this experience, during the session, and her perspective about continued attendance. She has not attended any meetings since that time. Therapist praised Jackqulyn for her effort to attend the session. We discussed possible barriers to continued attendance including having to drive at night. We explored this during the session. We discussed ways to build comfort, fine alternatives, and engage consistently. Mykenna discussed continued stress with her son who continues to be verbally antagonistic and her attempts to defend herself. We explored this during the session and ways to set additional boundaries going forward. We discussed ways to discontinue engagement with  son during these times.She discontinued the gabapentin due to side-effects and was prescribed Buspar bid but noted some dizziness as a result. She has a follow-up with her medical provider to discuss concerns. Therapist praised Zenaida for her effort and energy during the session.     Interventions:  BA & CBT    Diagnosis:  Other specified anxiety disorders   Plan: Patient is to use CBT, mindfulness and coping skills to help manage decrease symptoms associated with their diagnosis. (Target Date: 07/31/22)   Long-term goal:   Reduce overall level, frequency, and intensity of the feelings of depression and anxiety evidenced by  decreased sadness,  feelings of guilt, worry,  negative self talk, and helpless feelings from 6 to 7 days/week to 0 to 1 days/week per client report for at least 3 consecutive months.  Short-term goal:  Verbally express understanding of the relationship between feelings of depression, anxiety and their impact on thinking patterns and behaviors.  Verbalize an understanding of the role that distorted thinking plays in creating fears, excessive worry, and ruminations.  Engage in enjoyable activities and reduce interpersonal stressors through communication, assertiveness, and boundary setting.   Address interpersonal stressors with son via assertive behavior, boundary setting, and clear communication.    [name], LCSW

## 2022-07-20 ENCOUNTER — Ambulatory Visit (INDEPENDENT_AMBULATORY_CARE_PROVIDER_SITE_OTHER): Payer: Medicare HMO | Admitting: Psychology

## 2022-07-20 DIAGNOSIS — F418 Other specified anxiety disorders: Secondary | ICD-10-CM | POA: Diagnosis not present

## 2022-07-20 DIAGNOSIS — F331 Major depressive disorder, recurrent, moderate: Secondary | ICD-10-CM | POA: Diagnosis not present

## 2022-07-20 DIAGNOSIS — R69 Illness, unspecified: Secondary | ICD-10-CM | POA: Diagnosis not present

## 2022-07-20 DIAGNOSIS — F411 Generalized anxiety disorder: Secondary | ICD-10-CM | POA: Diagnosis not present

## 2022-07-20 NOTE — Progress Notes (Signed)
[place] Counselor/Therapist Progress Note  Patient ID: Zabella Wease, MRN: [id number]    Date: 07/20/22  Time Spent: 11:04  am - 11:56 am : 52 Minutes  Treatment Type: Individual Therapy.  Reported Symptoms: Interpersonal stressors.   Mental Status Exa Appearance:  Neat and Well Groomed     Behavior: Appropriate  Motor: Normal  Speech/Language:  Clear and Coherent and Normal Rate  Affect: Congruent  Mood: normal  Thought process: normal  Thought content:   WNL  Sensory/Perceptual disturbances:   WNL  Orientation: oriented to person, place, time/date, and situation  Attention: Good  Concentration: Good  Memory: WNL  Fund of knowledge:  Good  Insight:   Fair  Judgment:  Good  Impulse Control: Good   Risk Assessment: Danger to Self:  No Self-injurious Behavior: No Danger to Others: No Duty to Warn:no Physical Aggression / Violence:No  Access to Firearms a concern: No  Gang Involvement: No   Subjective:   [name] participated in the session, in person in the office with the therapist, and consented to treatment. Priscila reviewed the events of the past week. Dewey noted continued strain with her son, Hall Busing. She noted frustration regarding her son's lack of communication. She noted this being "one more dig". She noted the slew of arguments she often experiences. Latitia highlighted her son's antagonistic nature and noted the stressors she experiences as a result. We discussed verbal boundaries and resources were provided during the session. Therapist modeled this during the session. We discussed ways to enact this going forward.  She noted "I don't deserve this type of treatment of him". We worked on identifying ways to reframe this during the session and going forward. She noted a follow-up with her psychiatric provider and is slated to review medication and discussed her interest in getting assessed for ADHD. Therapist provided psychoeducation regarding  ADHD. Therapist encouraged self-care, mindfulness, and continued boundaries. Therapist praised Tayleigh for her effort and energy during the session.   Therapist provided supportive therapy.   Interventions: Cognitive Behavioral Therapy and Interpersonal   Diagnosis:  Other specified anxiety disorders   Plan: Patient is to use CBT, mindfulness and coping skills to help manage decrease symptoms associated with their diagnosis. (Target Date: 07/31/22)   Long-term goal:   Reduce overall level, frequency, and intensity of the feelings of depression and anxiety evidenced by  decreased sadness,  feelings of guilt, worry,  negative self talk, and helpless feelings from 6 to 7 days/week to 0 to 1 days/week per client report for at least 3 consecutive months.  Short-term goal:  Verbally express understanding of the relationship between feelings of depression, anxiety and their impact on thinking patterns and behaviors.  Verbalize an understanding of the role that distorted thinking plays in creating fears, excessive worry, and ruminations.  Engage in enjoyable activities and reduce interpersonal stressors through communication, assertiveness, and boundary setting.   Address interpersonal stressors with son via assertive behavior, boundary setting, and clear communication.    [name], LCSW

## 2022-08-02 ENCOUNTER — Ambulatory Visit (INDEPENDENT_AMBULATORY_CARE_PROVIDER_SITE_OTHER): Payer: Medicare HMO | Admitting: Psychology

## 2022-08-02 DIAGNOSIS — F418 Other specified anxiety disorders: Secondary | ICD-10-CM | POA: Diagnosis not present

## 2022-08-02 DIAGNOSIS — R69 Illness, unspecified: Secondary | ICD-10-CM | POA: Diagnosis not present

## 2022-08-02 NOTE — Progress Notes (Signed)
[place] Counselor/Therapist Progress Note  Patient ID: Tanya Barron, MRN: [id number]    Date: 08/02/22  Time Spent: 10:03  am - 11:01 am : 58 Minutes  Treatment Type: Individual Therapy.  Reported Symptoms: Interpersonal stressors.   Mental Status Exa Appearance:  Neat and Well Groomed     Behavior: Appropriate  Motor: Normal  Speech/Language:  Clear and Coherent and Normal Rate  Affect: Congruent  Mood: normal  Thought process: normal  Thought content:   WNL  Sensory/Perceptual disturbances:   WNL  Orientation: oriented to person, place, time/date, and situation  Attention: Good  Concentration: Good  Memory: WNL  Fund of knowledge:  Good  Insight:   Fair  Judgment:  Good  Impulse Control: Good   Risk Assessment: Danger to Self:  No Self-injurious Behavior: No Danger to Others: No Duty to Warn:no Physical Aggression / Violence:No  Access to Firearms a concern: No  Gang Involvement: No   Subjective:   Tanya Barron participated in the session, in person in the office with the therapist, and consented to treatment. Tanya Barron reviewed the events of the past week. She noted continued stress with her son who she noted has an unpredictable mood and communication style. She noted feelings of frustration, upset, and hurt. She noted "I am not good at standing up for myself. She noted feeling some guilt regarding "kicking tate out of the house".  We explored this during the session and Christmas provided in-depth background regarding her son's behavior that necessitated move.  She noted eventually losing her job due to his behavior and his court involvement.  We worked on feeling identification during session.  Therapist encouraged Tanya Barron to identify additional boundaries for Hall Busing in regards to his behavior and interactions with her.  Tanya Barron provided details and history of their recent conversations via text.  Therapist modeled boundary setting and assertive  communication.  We worked on highlighting positives and other areas of her life.  She was engaged and motivated in session.  She expressed commitment towards her goals.  We scheduled follow-ups for continued treatment.  Tanya Barron continues to benefit from sessions. Therapist provided supportive therapy.   Interventions: Cognitive Behavioral Therapy and Interpersonal   Diagnosis:  Other specified anxiety disorders   Plan: Patient is to use CBT, mindfulness and coping skills to help manage decrease symptoms associated with their diagnosis. (Target Date: 08/30/22)   Long-term goal:   Reduce overall level, frequency, and intensity of the feelings of depression and anxiety evidenced by  decreased sadness,  feelings of guilt, worry,  negative self talk, and helpless feelings from 6 to 7 days/week to 0 to 1 days/week per client report for at least 3 consecutive months.  Short-term goal:  Verbally express understanding of the relationship between feelings of depression, anxiety and their impact on thinking patterns and behaviors.  Verbalize an understanding of the role that distorted thinking plays in creating fears, excessive worry, and ruminations.  Engage in enjoyable activities and reduce interpersonal stressors through communication, assertiveness, and boundary setting.   Address interpersonal stressors with son via assertive behavior, boundary setting, and clear communication.    Tanya Irish, LCSW

## 2022-08-03 DIAGNOSIS — I700 Atherosclerosis of aorta: Secondary | ICD-10-CM | POA: Diagnosis not present

## 2022-08-03 DIAGNOSIS — N952 Postmenopausal atrophic vaginitis: Secondary | ICD-10-CM | POA: Diagnosis not present

## 2022-08-03 DIAGNOSIS — I10 Essential (primary) hypertension: Secondary | ICD-10-CM | POA: Diagnosis not present

## 2022-08-03 DIAGNOSIS — R7303 Prediabetes: Secondary | ICD-10-CM | POA: Diagnosis not present

## 2022-08-03 DIAGNOSIS — Z23 Encounter for immunization: Secondary | ICD-10-CM | POA: Diagnosis not present

## 2022-08-03 DIAGNOSIS — E785 Hyperlipidemia, unspecified: Secondary | ICD-10-CM | POA: Diagnosis not present

## 2022-08-03 DIAGNOSIS — Z111 Encounter for screening for respiratory tuberculosis: Secondary | ICD-10-CM | POA: Diagnosis not present

## 2022-08-13 ENCOUNTER — Ambulatory Visit: Payer: Medicare HMO

## 2022-08-13 ENCOUNTER — Ambulatory Visit
Admission: RE | Admit: 2022-08-13 | Discharge: 2022-08-13 | Disposition: A | Discharge status: Home / Self Care | Payer: Medicare HMO | Source: Ambulatory Visit | Attending: Family Medicine | Admitting: Family Medicine

## 2022-08-13 DIAGNOSIS — M8580 Other specified disorders of bone density and structure, unspecified site: Secondary | ICD-10-CM

## 2022-08-13 DIAGNOSIS — M810 Age-related osteoporosis without current pathological fracture: Secondary | ICD-10-CM | POA: Diagnosis not present

## 2022-08-13 DIAGNOSIS — M85851 Other specified disorders of bone density and structure, right thigh: Secondary | ICD-10-CM | POA: Diagnosis not present

## 2022-08-13 DIAGNOSIS — Z780 Asymptomatic menopausal state: Secondary | ICD-10-CM | POA: Diagnosis not present

## 2022-08-17 ENCOUNTER — Ambulatory Visit: Payer: Medicare HMO | Admitting: Psychology

## 2022-08-17 DIAGNOSIS — F418 Other specified anxiety disorders: Secondary | ICD-10-CM

## 2022-08-17 DIAGNOSIS — R69 Illness, unspecified: Secondary | ICD-10-CM | POA: Diagnosis not present

## 2022-08-17 NOTE — Progress Notes (Signed)
[facility] Counselor/Tanya Barron Progress Note  Patient ID: Tanya Barron, MRN: [id number]    Date: 08/17/22  Time Spent: 11:05 am - 12:04am : 61 Minutes  Treatment Type: Individual Therapy.  Reported Symptoms: Interpersonal stressors.   Mental Status Exa Appearance:  Neat and Well Groomed     Behavior: Appropriate  Motor: Normal  Speech/Language:  Clear and Coherent and Normal Rate  Affect: Depressed  Mood: sad  Thought process: normal  Thought content:   WNL  Sensory/Perceptual disturbances:   WNL  Orientation: oriented to person, place, time/date, and situation  Attention: Good  Concentration: Good  Memory: WNL  Fund of knowledge:  Good  Insight:   Fair  Judgment:  Good  Impulse Control: Good   Risk Assessment: Danger to Self:  No Self-injurious Behavior: No Danger to Others: No Duty to Warn:no Physical Aggression / Violence:No  Access to Firearms a concern: No  Gang Involvement: No   Subjective:   Tanya Barron participated in the session, in person in the office with the Tanya Barron, and consented to treatment. Tanya Barron reviewed the events of the past week. 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 She noted her nerves being shot and not knowing why. We explored this during the session and identified varying stressors that contributed to her overall stress level and mood. She noted being prescribed an non-stimulant ADHD medication, atomoxetine, and noted this being expensive, which is a barrier. We identified more affordable avenues. 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 We worked identify Tanya Barron, therapeutic and discussed ways to engage in self-care, schedule, address barriers employing BA principles.She noted consuming more alcohol as a result. She noted her intention to have a "sober October" but being unsuccessful. Tanya Barron praised Tanya Barron for interest  in gaining sobriety and we discussed the importance of building the foundation via a support group and recommended that Tanya Barron consider attending [employer]. She noted the pressures of being her daughter's caretaker into her daughter's 61's and noted feeling feelings of guilt. We began exploring this during the session. We scheduled follow-ups for continued treatment.  Tanya Barron continues to benefit from sessions. Tanya Barron provided supportive therapy.   Interventions: Cognitive Behavioral Therapy and Interpersonal   Diagnosis:  Other specified anxiety disorders   Plan: Patient is to use CBT, mindfulness and coping skills to help manage decrease symptoms associated with their diagnosis. (Target Date: 08/30/22)   Long-term goal:   Reduce overall level, frequency, and intensity of the feelings of depression and anxiety evidenced by  decreased sadness,  feelings of guilt, worry,  negative self talk, and helpless feelings from 6 to 7 days/week to 0 to 1 days/week per client report for at least 3 consecutive months.  Short-term goal:  Verbally express understanding of the relationship between feelings of depression, anxiety and their impact on thinking patterns and behaviors.  Verbalize an understanding of the role that distorted thinking plays in creating fears, excessive worry, and ruminations.  Engage in enjoyable activities and reduce interpersonal stressors through communication, assertiveness, and boundary setting.   Address interpersonal stressors with Tanya Barron via assertive behavior, boundary setting, and clear communication.    Tanya Irish, LCSW

## 2022-08-20 DIAGNOSIS — F419 Anxiety disorder, unspecified: Secondary | ICD-10-CM | POA: Diagnosis not present

## 2022-08-20 DIAGNOSIS — R69 Illness, unspecified: Secondary | ICD-10-CM | POA: Diagnosis not present

## 2022-08-20 DIAGNOSIS — T887XXA Unspecified adverse effect of drug or medicament, initial encounter: Secondary | ICD-10-CM | POA: Diagnosis not present

## 2022-08-20 DIAGNOSIS — F32A Depression, unspecified: Secondary | ICD-10-CM | POA: Diagnosis not present

## 2022-08-26 ENCOUNTER — Ambulatory Visit
Admission: RE | Admit: 2022-08-26 | Discharge: 2022-08-26 | Disposition: A | Discharge status: Home / Self Care | Payer: Medicare HMO | Source: Ambulatory Visit | Attending: Family Medicine | Admitting: Family Medicine

## 2022-08-26 DIAGNOSIS — Z1231 Encounter for screening mammogram for malignant neoplasm of breast: Secondary | ICD-10-CM | POA: Diagnosis not present

## 2022-08-31 ENCOUNTER — Ambulatory Visit: Payer: Medicare HMO | Admitting: Psychology

## 2022-09-13 ENCOUNTER — Ambulatory Visit: Payer: Medicare HMO | Admitting: Psychology

## 2022-09-13 DIAGNOSIS — R69 Illness, unspecified: Secondary | ICD-10-CM | POA: Diagnosis not present

## 2022-09-13 DIAGNOSIS — F418 Other specified anxiety disorders: Secondary | ICD-10-CM | POA: Diagnosis not present

## 2022-09-13 NOTE — Progress Notes (Signed)
[place] Counselor/Therapist Progress Note  Patient ID: Tanya Barron, MRN: [id number]    Date: 09/13/22  Time Spent: 12:32 pm - 1:25 pm :  53 Minutes  Treatment Type: Individual Therapy.  Reported Symptoms: Interpersonal stressors.   Mental Status Exa Appearance:  Neat and Well Groomed     Behavior: Appropriate  Motor: Normal  Speech/Language:  Clear and Coherent and Normal Rate  Affect: Depressed  Mood: sad  Thought process: normal  Thought content:   WNL  Sensory/Perceptual disturbances:   WNL  Orientation: oriented to person, place, time/date, and situation  Attention: Good  Concentration: Good  Memory: WNL  Fund of knowledge:  Good  Insight:   Fair  Judgment:  Good  Impulse Control: Good   Risk Assessment: Danger to Self:  No Self-injurious Behavior: No Danger to Others: No Duty to Warn:no Physical Aggression / Violence:No  Access to Firearms a concern: No  Gang Involvement: No   Subjective:   [name] participated in the session, in person in the office with the therapist, and consented to treatment. [name] reviewed the events of the past week including starting a new part-time job which she finds to be challenging and generally enjoyable. [facility] to a lack of connection with her provider. Therapist provided a referral for treatment with a different psychiatric provider, [facility]. Psyco-education regarding substance use was provided during the session. We discussed the importance of highlighting the behavior and effect of vs. The label. We will work on completing a substance abuse assessment during our follow-up session. [name] noted drinking 4-6 beers per night, having difficulty discontinuing drinking, and difficulty coping with the reduction of consumption. We began exploring this during the session. We scheduled follow-ups for continued treatment.  [name] continues to benefit from  sessions. Therapist provided supportive therapy.   Interventions: Cognitive Behavioral Therapy   Diagnosis:  Other specified anxiety disorders   Plan: Patient is to use CBT, mindfulness and coping skills to help manage decrease symptoms associated with their diagnosis. (Target Date: 09/30/22)   Long-term goal:   Reduce overall level, frequency, and intensity of the feelings of depression and anxiety evidenced by  decreased sadness,  feelings of guilt, worry,  negative self talk, and helpless feelings from 6 to 7 days/week to 0 to 1 days/week per client report for at least 3 consecutive months.  Short-term goal:  Verbally express understanding of the relationship between feelings of depression, anxiety and their impact on thinking patterns and behaviors.  Verbalize an understanding of the role that distorted thinking plays in creating fears, excessive worry, and ruminations.  Engage in enjoyable activities and reduce interpersonal stressors through communication, assertiveness, and boundary setting.   Address interpersonal stressors with son via assertive behavior, boundary setting, and clear communication.    [name], LCSW

## 2022-09-20 IMAGING — MG MM DIGITAL SCREENING BILAT W/ TOMO AND CAD
6 of 10 series · 6 of 30 positions shown · non-contrast
Comparison: None.
COMPARISON: None.

Addendum:
CLINICAL DATA: Screening.

EXAM:
DIGITAL SCREENING BILATERAL MAMMOGRAM WITH TOMOSYNTHESIS AND CAD
TECHNIQUE: Bilateral screening digital craniocaudal and mediolateral oblique
mammograms were obtained. Bilateral screening digital breast
tomosynthesis was performed. The images were evaluated with
computer-aided detection.

[L MLO synth-2D]
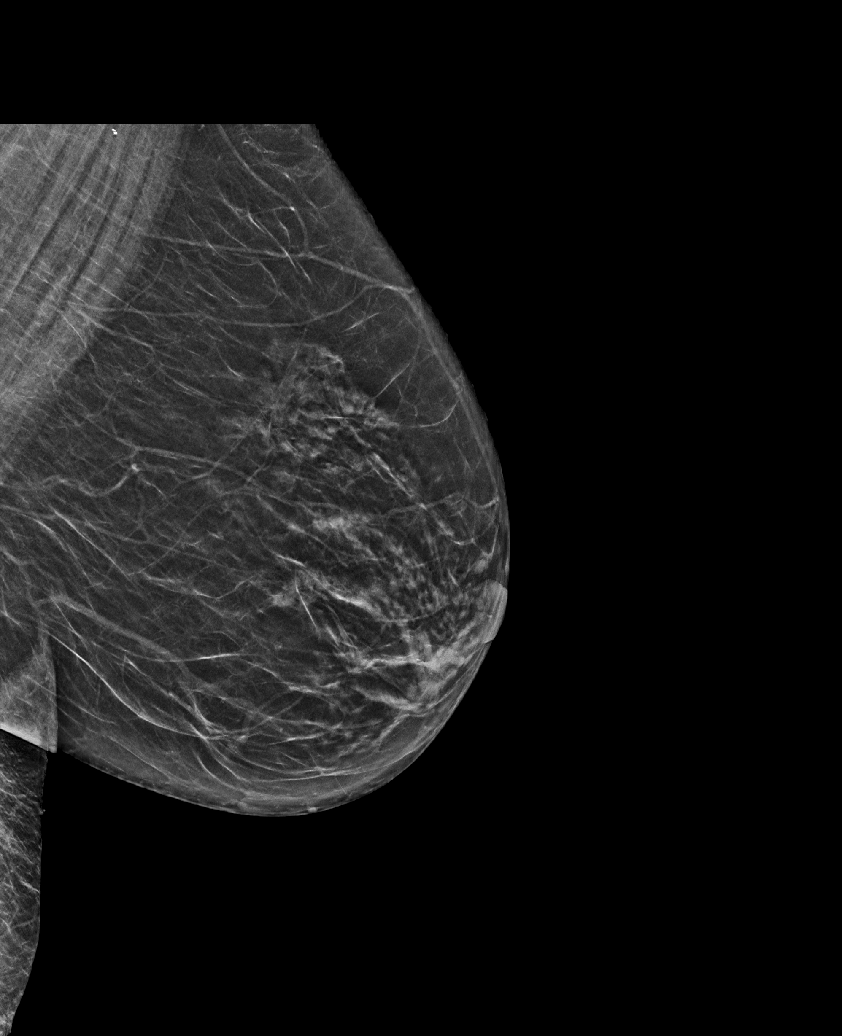

[R CC synth-2D]
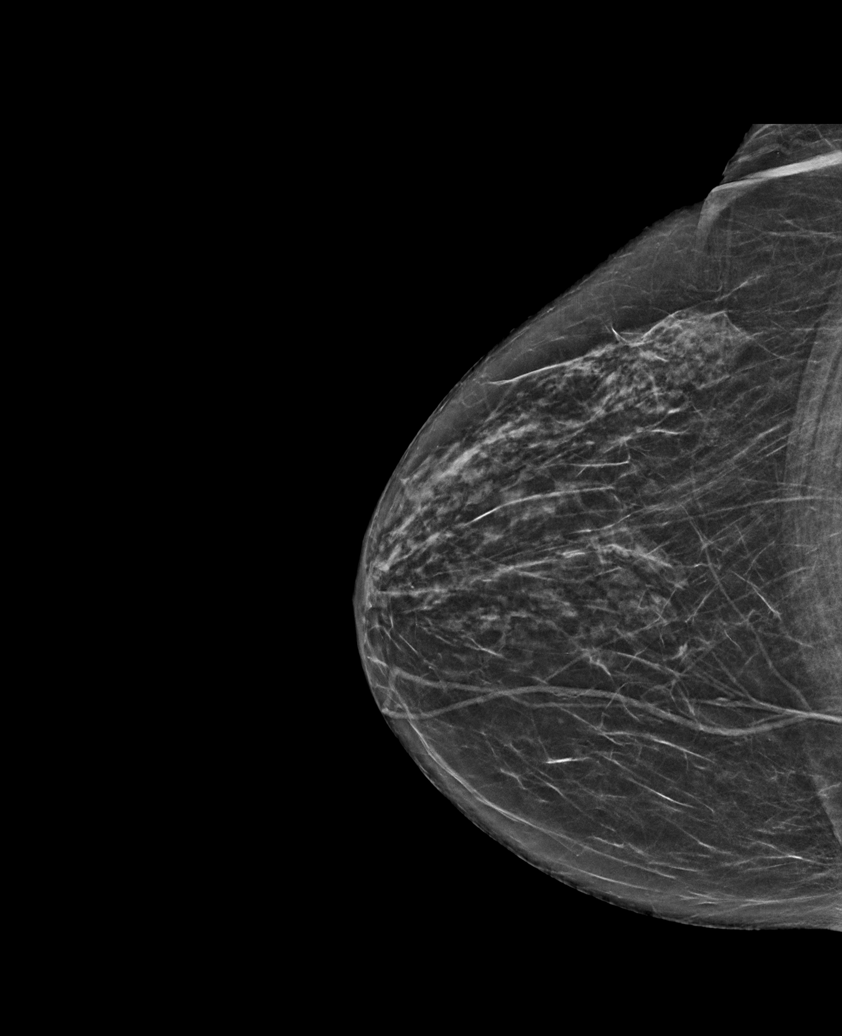

[R MLO synth-2D (1 of 2)]
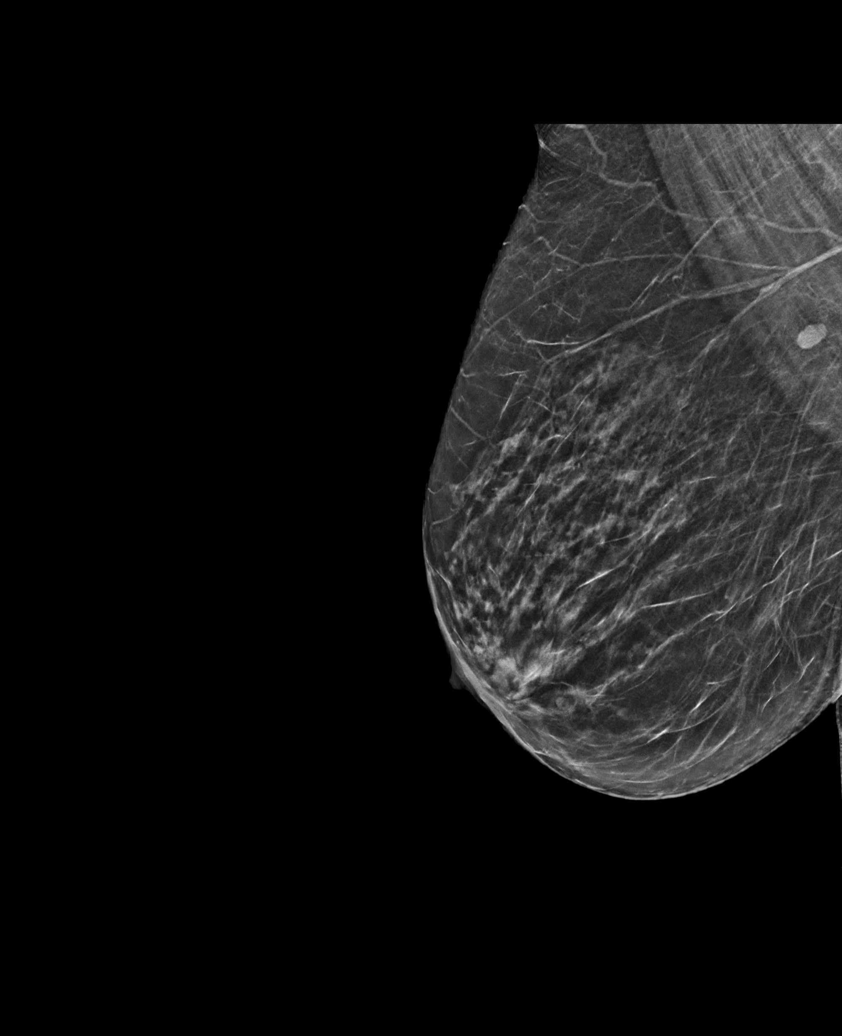

[R MLO synth-2D (2 of 2)]
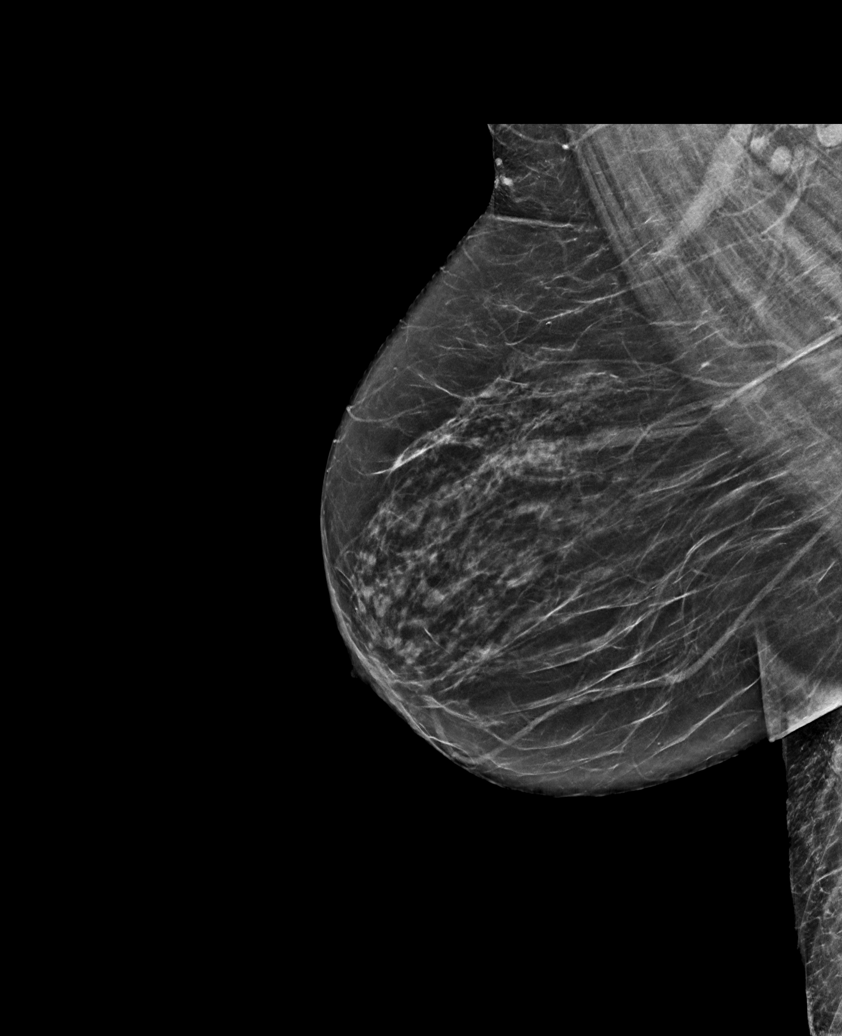

[L CC synth-2D]
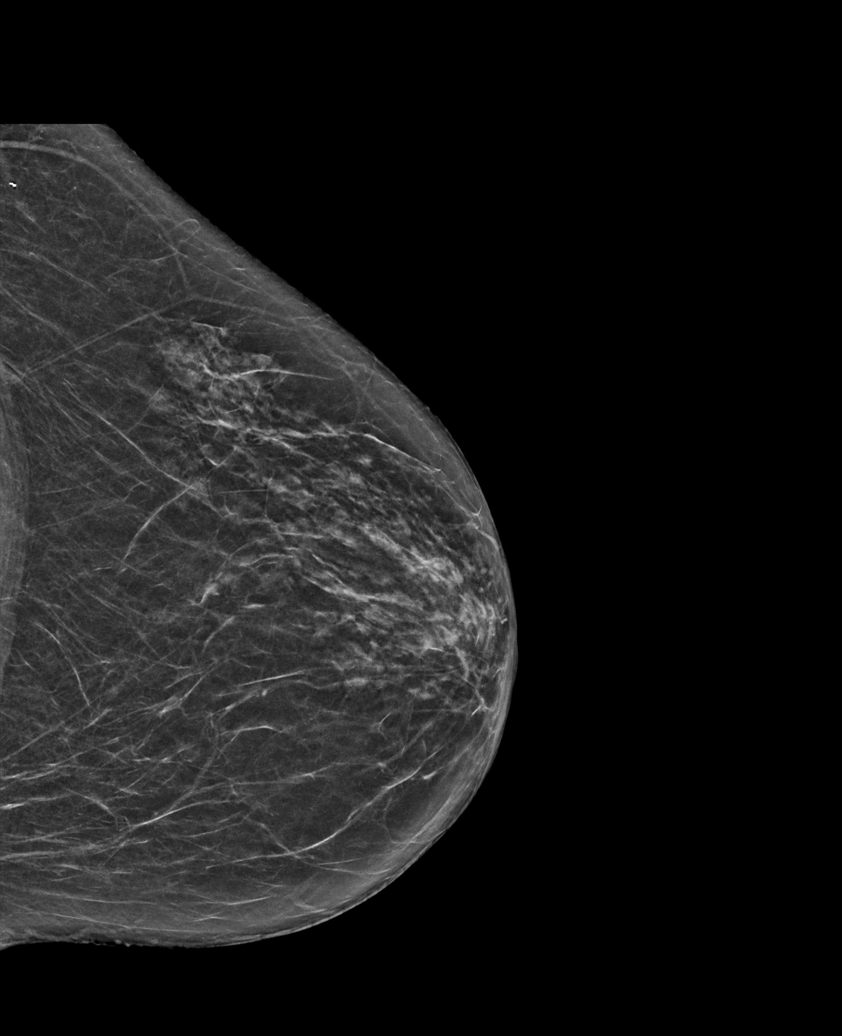

[R CC tomo · tomo slice 27/53.0]
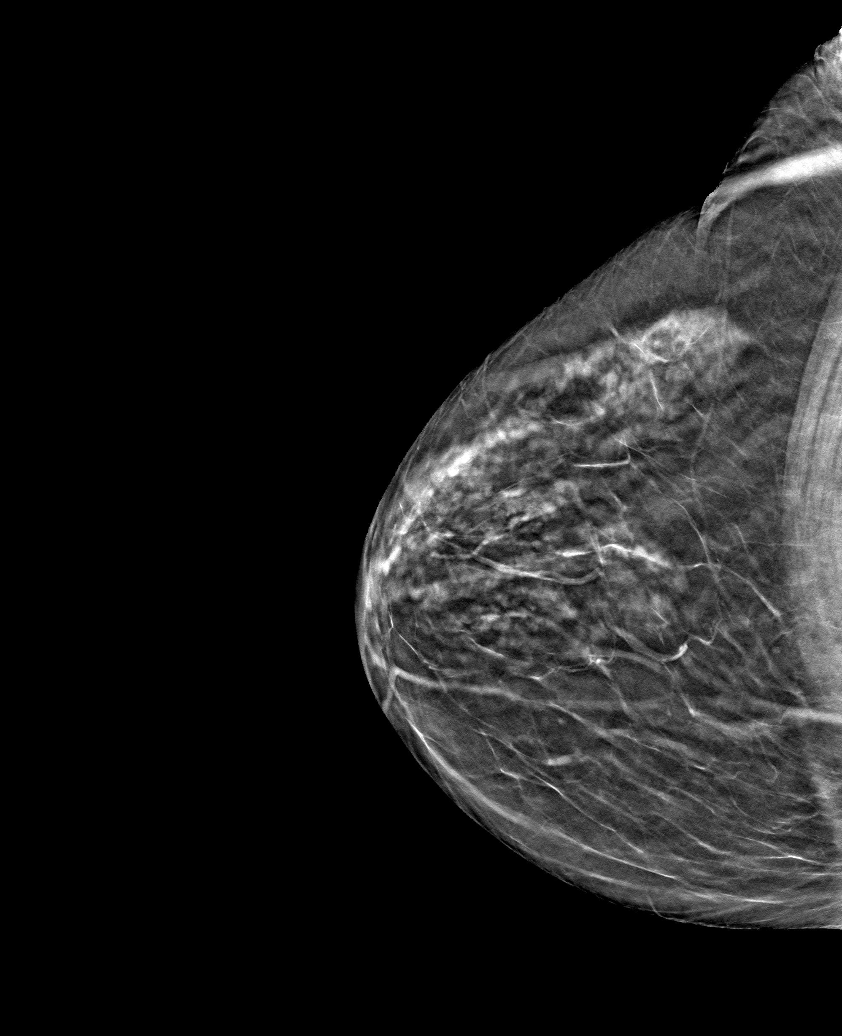

[6 of 30 positions shown; findings below may reference images not displayed]

ACR Breast Density Category c: The breast tissue is heterogeneously
dense, which may obscure small masses
FINDINGS: There are no findings suspicious for malignancy.
IMPRESSION: No mammographic evidence of malignancy. A result letter of this
screening mammogram will be mailed directly to the patient.

RECOMMENDATION:
Screening mammogram in one year. (Code:9M-N-CK7)

BI-RADS CATEGORY  1: Negative.

ADDENDUM:
Prior exams are now available for comparison. There has been no
interval mammographic change. There is no change to the current
impression, recommendation or BI-RADS category.

*** End of Addendum ***
ACR Breast Density Category c: The breast tissue is heterogeneously
dense, which may obscure small masses
FINDINGS: There are no findings suspicious for malignancy.
IMPRESSION: No mammographic evidence of malignancy. A result letter of this
screening mammogram will be mailed directly to the patient.

RECOMMENDATION:
Screening mammogram in one year. (Code:9M-N-CK7)

BI-RADS CATEGORY  1: Negative.

## 2022-10-04 ENCOUNTER — Ambulatory Visit (INDEPENDENT_AMBULATORY_CARE_PROVIDER_SITE_OTHER): Payer: Medicare HMO | Admitting: Psychology

## 2022-10-04 DIAGNOSIS — R69 Illness, unspecified: Secondary | ICD-10-CM | POA: Diagnosis not present

## 2022-10-04 DIAGNOSIS — F418 Other specified anxiety disorders: Secondary | ICD-10-CM | POA: Diagnosis not present

## 2022-10-04 NOTE — Progress Notes (Signed)
[place] Counselor/Therapist Progress Note  Patient ID: Tanya Barron, MRN: [id number]    Date: 10/04/22  Time Spent: 10:02 am - 10:58 am :  57 Minutes  Treatment Type: Individual Therapy.  Reported Symptoms: Interpersonal stressors.   Mental Status Exa Appearance:  Neat and Well Groomed     Behavior: Appropriate  Motor: Normal  Speech/Language:  Clear and Coherent and Normal Rate  Affect: Depressed  Mood: sad  Thought process: normal  Thought content:   WNL  Sensory/Perceptual disturbances:   WNL  Orientation: oriented to person, place, time/date, and situation  Attention: Good  Concentration: Good  Memory: WNL  Fund of knowledge:  Good  Insight:   Fair  Judgment:  Good  Impulse Control: Good   Risk Assessment: Danger to Self:  No Self-injurious Behavior: No Danger to Others: No Duty to Warn:no Physical Aggression / Violence:No  Access to Firearms a concern: No  Gang Involvement: No   Subjective:   Tanya Barron participated in the session, in person in the office with the therapist, and consented to treatment. Tanya Barron reviewed the events of the past week. She noted having a recent visit from her Son and noted that despite our work to work on conflict resolution, the conversation went poorly. She noted his attitude being "holier than thou". She noted her son's instance to discuss topics that are not pertinent to him. She noted working on setting boundaries during the discussion with her son. She noted the conversation being negative and unproductive. We worked on identifying positives within this conversation.  She will work on following up with psychiatric resources going forward. She noted looking forward to thanksgivings but her anxiety about her son's possible behavior. We explored this and therapist modeled ways to address concerns verbally. We role-played during the session ways to communicate boundaries and assertiveness. We scheduled follow-ups  for continued treatment.  Tanya Barron continues to benefit from sessions. Therapist provided supportive therapy.   Interventions: Cognitive Behavioral Therapy   Diagnosis:  Other specified anxiety disorders   Plan: Patient is to use CBT, mindfulness and coping skills to help manage decrease symptoms associated with their diagnosis. (Target Date: 10/30/22)   Long-term goal:   Reduce overall level, frequency, and intensity of the feelings of depression and anxiety evidenced by  decreased sadness,  feelings of guilt, worry,  negative self talk, and helpless feelings from 6 to 7 days/week to 0 to 1 days/week per client report for at least 3 consecutive months.  Short-term goal:  Verbally express understanding of the relationship between feelings of depression, anxiety and their impact on thinking patterns and behaviors.  Verbalize an understanding of the role that distorted thinking plays in creating fears, excessive worry, and ruminations.  Engage in enjoyable activities and reduce interpersonal stressors through communication, assertiveness, and boundary setting.   Address interpersonal stressors with son via assertive behavior, boundary setting, and clear communication.    [name], LCSW

## 2022-10-15 DIAGNOSIS — J019 Acute sinusitis, unspecified: Secondary | ICD-10-CM | POA: Diagnosis not present

## 2022-10-18 ENCOUNTER — Ambulatory Visit: Payer: Medicare HMO | Admitting: Psychology

## 2022-10-18 DIAGNOSIS — F418 Other specified anxiety disorders: Secondary | ICD-10-CM

## 2022-10-18 DIAGNOSIS — R69 Illness, unspecified: Secondary | ICD-10-CM | POA: Diagnosis not present

## 2022-10-18 NOTE — Progress Notes (Signed)
[facility] Counselor Initial Adult Exam  Name: Tanya Barron Date: 10/18/2022 MRN: [id number] DOB: 1951-07-01 PCP: Caren Macadam, MD  Time Spent: 10:04  am - 11:00 am : 56 Minutes  Guardian/Payee:  Self    Paperwork requested: No   Reason for Visit /Presenting Problem: Depression and anxiety.   Mental Status Exam: Appearance:   Neat and Well Groomed     Behavior:  Appropriate  Motor:  Normal  Speech/Language:   Clear and Coherent  Affect:  Appropriate  Mood:  anxious  Thought process:  normal  Thought content:    WNL  Sensory/Perceptual disturbances:    WNL  Orientation:  oriented to person, place, time/date, and situation  Attention:  Good  Concentration:  Good  Memory:  WNL  Fund of knowledge:   Good  Insight:    Good  Judgment:   Good  Impulse Control:  Good   Reported Symptoms:  anxiety and depression.   Risk Assessment: Danger to Self:  No Self-injurious Behavior: No Danger to Others: No Duty to Warn:no Physical Aggression / Violence:No  Access to Firearms a concern: No  Gang Involvement:No  Patient / guardian was educated about steps to take if suicide or homicide risk level increases between visits: no While future psychiatric events cannot be accurately predicted, the patient does not currently require acute inpatient psychiatric care and does not currently meet [facility] involuntary commitment criteria.  Substance Abuse History: Current substance abuse: Yes     Alcohol Use: 4-6x beers per night.    Past Psychiatric History:   Previous psychological history is significant for anxiety and depression Outpatient Providers:Jedidiah Demartini, [place].  History of Psych Hospitalization: No  Psychological Testing:  NA    Abuse History:  Victim of: No.,  NA  , mother was narcissistic and demanding.  Report needed: No. Victim of Neglect:No. Perpetrator of  NA   Witness / Exposure to Domestic Violence: No    Protective Services Involvement: No  Witness to [employer] Violence:  No   Family History:  Family History  Problem Relation Age of Onset   Alzheimer's disease Mother    Heart disease Father     Living situation: the patient lives with their daughter  Sexual Orientation: Straight  Relationship Status: divorced  Name of spouse / other:na If a parent, number of children / ages:Tate (70) & Tanya Barron (66)  Support Systems: lives with daughter.   Financial Stress:  Yes   Income/Employment/Disability: retired  Armed forces logistics/support/administrative officer: No   Educational History: Education: Scientist, product/process development: NA  Any cultural differences that may affect / interfere with treatment:  not applicable   Recreation/Hobbies: painting  Stressors: Financial difficulties   Marital or family conflict    Strengths: Hopefulness and Conservator, museum/gallery  Barriers:  finances & mood.    Legal History: Pending legal issue / charges: The patient has no significant history of legal issues. History of legal issue / charges:  NA  Medical History/Surgical History: reviewed Past Medical History:  Diagnosis Date   HTN (hypertension)    Seasonal allergies     Past Surgical History:  Procedure Laterality Date   CARPAL TUNNEL RELEASE     SPINAL FUSION     TRIGGER FINGER RELEASE      Medications: Current Outpatient Medications  Medication Sig Dispense Refill   alendronate (FOSAMAX) 70 MG tablet Take 70 mg by mouth once a week.     amLODipine (NORVASC) 5 MG tablet  aspirin EC 81 MG tablet Take 1 tablet (81 mg total) by mouth daily. Swallow whole. 90 tablet 3   atorvastatin (LIPITOR) 40 MG tablet Take 1 tablet by mouth daily.     cetirizine (ZYRTEC) 10 MG tablet Take by mouth.     pantoprazole (PROTONIX) 40 MG tablet Take 1 tablet by mouth daily.     PARoxetine (PAXIL) 40 MG tablet Take 60 mg by mouth daily.     triamterene-hydrochlorothiazide (DYAZIDE) 37.5-25 MG capsule      No  current facility-administered medications for this visit.    Allergies  Allergen Reactions   Sulfamethoxazole-Trimethoprim Hives    Diagnoses:  Other specified anxiety disorders  Psychiatric Treatment: No , previously seen at [facility].   Plan of Care: Outpatient Therapy and Psychiatric treatment.   Narrative:  Tanya Barron participated from office with therapist and consented to treatment. We reviewed the limits of confidentiality prior to the start of the evaluation. Tanya Barron expressed understanding and agreement to proceed. This is Tanya Barron's annual reassessment. Stressors include interpersonal stressors with son and daughter, financial stressors, and alcohol consumption. Strain with son due to past history, varying political and religious leanings, poor communication, and history of unresolved conflicts in his childhood and young adulthood. She noted her daughter, Tanya Barron, who is overly dependant on Tanya Barron for socializing and entertainment, a lack of self-care, and a lack of work on self. She noted the stressors of being her care-taker and noted the  varying roles she holds as both a parent and roommate and the difficulty managing ways to manage these varying roles. She noted her daughter experiencing a life-altering accident that affected her physically and required numerous surgeries. Tanya Barron noted that frustration regarding her daughter's lack of transparency regarding finances and noted this being a point of contention due to Tanya Barron's need of financial independence while continuing to be dependent in many other ways.She noted a lack of autonomy and sense of self due to always being "on" and a lack of self-care and engagement in enjoyable activities. Tanya Barron discussed her drinking, during the session, and noted this possibly being due to the pressure she feels being a care-taker and noting that Tanya Barron's over-dependence on her for day-to-day tasks.. She noted a need to set boundaries,  going forward, which will be a treatment goal.Tanya Barron will work on creating treatment goals between sessions. Tanya Barron would benefit from a psyciatric consult and is awaiting response for a local practice for an intake appointment. Additional resources will be provided upon request. Tanya Barron presented as intelligent, self-ware, and motivated for change. She would benefit from substance abuse counseling or joining a 12-step program.   [name], LCSW

## 2022-10-22 DIAGNOSIS — E785 Hyperlipidemia, unspecified: Secondary | ICD-10-CM | POA: Diagnosis not present

## 2022-10-22 DIAGNOSIS — J209 Acute bronchitis, unspecified: Secondary | ICD-10-CM | POA: Diagnosis not present

## 2022-11-01 ENCOUNTER — Ambulatory Visit (INDEPENDENT_AMBULATORY_CARE_PROVIDER_SITE_OTHER): Payer: Medicare HMO | Admitting: Psychology

## 2022-11-01 DIAGNOSIS — F418 Other specified anxiety disorders: Secondary | ICD-10-CM

## 2022-11-01 DIAGNOSIS — R69 Illness, unspecified: Secondary | ICD-10-CM | POA: Diagnosis not present

## 2022-11-01 NOTE — Progress Notes (Signed)
[facility] Counselor/Therapist Progress Note  Patient ID: Jonquil Stubbe, MRN: [id number]    Date: 11/01/22  Time Spent: 11:05  am - 11:53 am : 31 Minutes  Treatment Type: Individual Therapy.  Reported Symptoms: Depression, anxiety, ADHD, and interpersonal stressors.   Mental Status Exam: Appearance:  Casual     Behavior: Appropriate  Motor: Normal  Speech/Language:  Clear and Coherent  Affect: Appropriate  Mood: normal  Thought process: normal  Thought content:   WNL  Sensory/Perceptual disturbances:   WNL  Orientation: oriented to person, place, time/date, and situation  Attention: Good  Concentration: Good  Memory: WNL  Fund of knowledge:  Good  Insight:   Good  Judgment:  Good  Impulse Control: Good   Risk Assessment: Danger to Self:  No Self-injurious Behavior: No Danger to Others: No Duty to Warn:no Physical Aggression / Violence:No  Access to Firearms a concern: No  Gang Involvement:No   Subjective:   [name] participated in the session, in person in the office with the therapist, and consented to treatment. Safira reviewed the events of the past week. We reviewed numerous treatment approaches including CBT, BA, Problem Solving, and Solution focused therapy. Psych-education regarding the Jamilyn's diagnosis of Other specified anxiety disorders was provided during the session. We discussed Phyllistine Bourbon's goals treatment goals which include actively manage her finances, begin psychiatric treatment, improve fitness and health, reduce alcohol consumption,  spend more time outside, increase mindfulness, manage impulsivity, become more purposeful. [name] provided verbal approval of the treatment plan. We discussed the possibility of creating a schedule of activities and tasks for the day but noted having a written list causes some resistance. She could not identify a reason for this resistance. She did not a sense of responsibility with the  list along with possible executive functioning issues.   Interventions: Psycho-education & Goal Setting.   Diagnosis:   Other specified anxiety disorders  Psychiatric Treatment: No , but is pursing treatment via [place]:  Client Abilities/Strengths Maili is intelligent, forthcoming, and motivated for change .  Support System: Daughter.   Client Treatment Preferences Outpatient therapy.   Client Statement of Needs Lalanya would like to more actively manage her finances, begin psychiatric treatment, improve fitness and health, reduce alcohol consumption,  spend more time outside, increase mindfulness, manage impulsivity, become more purposeful.   Treatment Level Weekly  Symptoms  Anxiety: worry, difficulty managing worry, worrying about different things, trouble relaxing, restlessness, irritability, feeling afraid something awful might happen.  (Status: maintained) Depression, loss of interest, feeling down, lethargy, poor appetite, trouble concentrating, psychomotor agitation, denied SI   (Status: maintained)  Goals:   Alizza experiences symptoms of depression, anxiety, interpersonal stressors, and ADHD.    Target Date: 11/02/23 Frequency: Weekly  Progress: 0 Modality: individual    Therapist will provide referrals for additional resources as appropriate.  Therapist will provide psycho-education regarding Kikue's diagnosis and corresponding treatment approaches and interventions. Licensed Clinical Social Worker, [place], LCSW will support the patient's ability to achieve the goals identified. will employ CBT, BA, Problem-solving, Solution Focused, Mindfulness,  coping skills, & other evidenced-based practices will be used to promote progress towards healthy functioning to help manage decrease symptoms associated with her diagnosis.   Reduce overall level, frequency, and intensity of the feelings of depression, anxiety and panic evidenced  by decreased overall symptoms from 6 to 7 days/week to 0 to 1 days/week per client report for at least 3 consecutive months. Verbally express understanding  of the relationship between feelings of depression, anxiety and their impact on thinking patterns and behaviors. Verbalize an understanding of the role that distorted thinking plays in creating fears, excessive worry, and ruminations.  [name] participated in the creation of the treatment plan)

## 2022-11-18 DIAGNOSIS — U071 COVID-19: Secondary | ICD-10-CM | POA: Diagnosis not present

## 2022-11-18 DIAGNOSIS — R051 Acute cough: Secondary | ICD-10-CM | POA: Diagnosis not present

## 2022-11-18 DIAGNOSIS — Z03818 Encounter for observation for suspected exposure to other biological agents ruled out: Secondary | ICD-10-CM | POA: Diagnosis not present

## 2022-11-22 ENCOUNTER — Ambulatory Visit: Payer: Medicare HMO | Admitting: Psychology

## 2022-12-06 ENCOUNTER — Ambulatory Visit (INDEPENDENT_AMBULATORY_CARE_PROVIDER_SITE_OTHER): Payer: Medicare HMO | Admitting: Psychology

## 2022-12-06 DIAGNOSIS — F418 Other specified anxiety disorders: Secondary | ICD-10-CM | POA: Diagnosis not present

## 2022-12-06 DIAGNOSIS — R69 Illness, unspecified: Secondary | ICD-10-CM | POA: Diagnosis not present

## 2022-12-06 NOTE — Progress Notes (Signed)
[facility] Counselor/Therapist Progress Note  Patient ID: Tanya Barron, MRN: [id number]    Date: 12/06/22  Time Spent: 11:02  am - 11:53 am : 51 Minutes  Treatment Type: Individual Therapy.  Reported Symptoms: Depression, anxiety, ADHD, and interpersonal stressors.   Mental Status Exam: Appearance:  Casual     Behavior: Appropriate  Motor: Normal  Speech/Language:  Clear and Coherent  Affect: Appropriate  Mood: normal  Thought process: normal  Thought content:   WNL  Sensory/Perceptual disturbances:   WNL  Orientation: oriented to person, place, time/date, and situation  Attention: Good  Concentration: Good  Memory: WNL  Fund of knowledge:  Good  Insight:   Good  Judgment:  Good  Impulse Control: Good   Risk Assessment: Danger to Self:  No Self-injurious Behavior: No Danger to Others: No Duty to Warn:no Physical Aggression / Violence:No  Access to Firearms a concern: No  Gang Involvement:No   Subjective:   [name] participated in the session, in person in the office with the therapist, and consented to treatment. Zemirah reviewed the events of the past week. She noted things being "shitty" due to financial stressors. We explored this and her attempts to communicate needs to daughter who lives with [name] and does not want to discuss finances with. She noted her plan to contact [facility] to follow-up with the psychiatric provider. She noted her routine being disrupted due to finances. We explored this during the session and ways to address these barriers including communication, problem-solving, assertiveness, and empathy.  She noted feeling "blah" in relation to task completion at home and her routine being unstructured. We worked on creating a structured routine starting with two simple tasks with one focused on the home and one for self-care. Therapist employed BA principles to identify tasks, previous and current boundaries to  completion, setting reasonable expectations in regards to frequency and duration, scheduling tasks and maintaining connection to organizer, and preparation for the tasks. Therapist modeled this during the session and encouraged problem-solving. Geraldyne was engaged and motivated during the session. Therapist provided supportive therapy and psycho-education.   Interventions: CBT and BA  Diagnosis:   Other specified anxiety disorders  Psychiatric Treatment: No , but is pursing treatment via [place]:  Client Abilities/Strengths Jessicaann is intelligent, forthcoming, and motivated for change .  Support System: Daughter.   Client Treatment Preferences Outpatient therapy.   Client Statement of Needs Rudolph would like to more actively manage her finances, begin psychiatric treatment, improve fitness and health, reduce alcohol consumption,  spend more time outside, increase mindfulness, manage impulsivity, become more purposeful.   Treatment Level Weekly  Symptoms  Anxiety: worry, difficulty managing worry, worrying about different things, trouble relaxing, restlessness, irritability, feeling afraid something awful might happen.  (Status: maintained) Depression, loss of interest, feeling down, lethargy, poor appetite, trouble concentrating, psychomotor agitation, denied SI   (Status: maintained)  Goals:   Lyza experiences symptoms of depression, anxiety, interpersonal stressors, and ADHD.    Target Date: 11/02/23 Frequency: Weekly  Progress: 0 Modality: individual    Therapist will provide referrals for additional resources as appropriate.  Therapist will provide psycho-education regarding Celene's diagnosis and corresponding treatment approaches and interventions. Licensed Clinical Social Worker, [place], LCSW will support the patient's ability to achieve the goals identified. will employ CBT, BA, Problem-solving, Solution Focused, Mindfulness,   coping skills, & other evidenced-based practices will be used to promote progress towards healthy functioning to help manage decrease symptoms associated with  her diagnosis.   Reduce overall level, frequency, and intensity of the feelings of depression, anxiety and panic evidenced by decreased overall symptoms from 6 to 7 days/week to 0 to 1 days/week per client report for at least 3 consecutive months. Verbally express understanding of the relationship between feelings of depression, anxiety and their impact on thinking patterns and behaviors. Verbalize an understanding of the role that distorted thinking plays in creating fears, excessive worry, and ruminations.  [name] participated in the creation of the treatment plan)

## 2022-12-20 ENCOUNTER — Ambulatory Visit: Payer: Medicare HMO | Admitting: Psychology

## 2022-12-20 DIAGNOSIS — F418 Other specified anxiety disorders: Secondary | ICD-10-CM

## 2022-12-20 DIAGNOSIS — R69 Illness, unspecified: Secondary | ICD-10-CM | POA: Diagnosis not present

## 2022-12-20 NOTE — Progress Notes (Signed)
**Note Tanya Barron-Identified via Obfuscation** [facility] Counselor/Therapist Progress Note  Patient ID: Tanya Barron, MRN: [id number]    Date: 12/20/22  Time Spent: 11:03  am - 11:58 am : 55 Minutes  Treatment Type: Individual Therapy.  Reported Symptoms: Depression, anxiety, ADHD, and interpersonal stressors.   Mental Status Exam: Appearance:  Casual     Behavior: Appropriate  Motor: Normal  Speech/Language:  Clear and Coherent  Affect: Appropriate  Mood: dysthymic  Thought process: normal  Thought content:   WNL  Sensory/Perceptual disturbances:   WNL  Orientation: oriented to person, place, time/date, and situation  Attention: Good  Concentration: Good  Memory: WNL  Fund of knowledge:  Good  Insight:   Good  Judgment:  Good  Impulse Control: Good   Risk Assessment: Danger to Self:  No Self-injurious Behavior: No Danger to Others: No Duty to Warn:no Physical Aggression / Violence:No  Access to Firearms a concern: No  Gang Involvement:No   Subjective:   Tanya Barron participated in the session, in person in the office with the therapist, and consented to treatment. Tanya Barron reviewed the events of the past week. She was tearful during the session regarding the "hole in her heart". She noted missing having a companion and was tearful during the session. She noted being reminded of a simpler time and noted the lyrics being meaningful to her. We worked on identifying barriers to pursing meaningful companionship. She noted her intent to follow-up with [facility] for her initial consult and noted the likelihood of being diagnosed with ADHD due to her inability to focus and concentrate. We worked on identifying the barriers to engagement in activities and ways to manage this. Barriers include Tanya Barron's implicit feedback about Tanya Barron meeting new people, feeling like a "martyr",  and difficulty engaging in a activities that Barron increase likelihood of meeting others. Therapist encouraged Tanya Barron to  identify what he role, as a parent, is going forward and outline this for herself and how to see herself aside of a care-taker. Therapist encouraged Tanya Barron to outline how she sees herself to be processed going forward. Tanya Barron was engaged and motivated during the session. Therapist provided supportive therapy and psycho-education.   Interventions: CBT and BA  Diagnosis:   Other specified anxiety disorders  Psychiatric Treatment: No , but is pursing treatment via [place]:  Client Abilities/Strengths Tanya Barron is intelligent, forthcoming, and motivated for change .  Support System: Tanya Barron.   Client Treatment Preferences Outpatient therapy.   Client Statement of Needs Tanya Barron like to more actively manage her finances, begin psychiatric treatment, improve fitness and health, reduce alcohol consumption,  spend more time outside, increase mindfulness, manage impulsivity, become more purposeful.   Treatment Level Weekly  Symptoms  Anxiety: worry, difficulty managing worry, worrying about different things, trouble relaxing, restlessness, irritability, feeling afraid something awful might happen.  (Status: maintained) Depression, loss of interest, feeling down, lethargy, poor appetite, trouble concentrating, psychomotor agitation, denied SI   (Status: maintained)  Goals:   Tanya Barron experiences symptoms of depression, anxiety, interpersonal stressors, and ADHD.    Target Date: 11/02/23 Frequency: Weekly  Progress: 0 Modality: individual    Therapist will provide referrals for additional resources as appropriate.  Therapist will provide psycho-education regarding Tanya Barron's diagnosis and corresponding treatment approaches and interventions. Licensed Clinical Social Worker, [place], LCSW will support the patient's ability to achieve the goals identified. will employ CBT, BA, Problem-solving, Solution Focused, Mindfulness,  coping skills, & other  evidenced-based practices will be used to promote progress towards  healthy functioning to help manage decrease symptoms associated with her diagnosis.   Reduce overall level, frequency, and intensity of the feelings of depression, anxiety and panic evidenced by decreased overall symptoms from 6 to 7 days/week to 0 to 1 days/week per client report for at least 3 consecutive months. Verbally express understanding of the relationship between feelings of depression, anxiety and their impact on thinking patterns and behaviors. Verbalize an understanding of the role that distorted thinking plays in creating fears, excessive worry, and ruminations.  [name] participated in the creation of the treatment plan)

## 2023-01-03 ENCOUNTER — Ambulatory Visit (INDEPENDENT_AMBULATORY_CARE_PROVIDER_SITE_OTHER): Payer: Medicare HMO | Admitting: Psychology

## 2023-01-03 DIAGNOSIS — R69 Illness, unspecified: Secondary | ICD-10-CM | POA: Diagnosis not present

## 2023-01-03 DIAGNOSIS — F418 Other specified anxiety disorders: Secondary | ICD-10-CM | POA: Diagnosis not present

## 2023-01-03 NOTE — Progress Notes (Signed)
[facility] Counselor/Therapist Progress Note  Patient ID: Tanya Barron, MRN: [id number]    Date: 01/03/23  Time Spent: 12:00  pm - 12:47 pm : 47 Minutes  Treatment Type: Individual Therapy.  Reported Symptoms: Depression, anxiety, ADHD, and interpersonal stressors.   Mental Status Exam: Appearance:  Casual     Behavior: Appropriate  Motor: Normal  Speech/Language:  Clear and Coherent  Affect: Appropriate  Mood: dysthymic  Thought process: normal  Thought content:   WNL  Sensory/Perceptual disturbances:   WNL  Orientation: oriented to person, place, time/date, and situation  Attention: Good  Concentration: Good  Memory: WNL  Fund of knowledge:  Good  Insight:   Good  Judgment:  Good  Impulse Control: Good   Risk Assessment: Danger to Self:  No Self-injurious Behavior: No Danger to Others: No Duty to Warn:no Physical Aggression / Violence:No  Access to Firearms a concern: No  Gang Involvement:No   Subjective:   Tanya Barron participated in the session, in person in the office with the therapist, and consented to treatment. Tanya Barron reviewed the events of the past week. Tanya Barron noted being slated to meed a psychiatric provider, Tanya Barron with [facility], for an initial consult in a week. Tanya Barron noted stress with her son and noted him being "passive aggressive". Tanya Barron noted her interest in discontinuing drinking and noted her age and health being a motivating factor. Tanya Barron noted struggling with engaging in enjoyable activities. Tanya Barron noted watching TV and discussed the news being distressing. We worked on identifying alternative behaviors to engage in that might better align with her goals and mood. Tanya Barron noted walking twice in the past two weeks and found it to be enjoyable and noticed the temporary improvement in her mood. Tanya Barron noted her plans are "spur of the moment". Tanya Barron noted difficulty creating a more concrete routine and using a calendar. Tanya Barron noted  difficulty starting a routine. Tanya Barron worked on identifying her mother being pushy and controlling. Tanya Barron noted difficulty when interacting with "controlling people". Tanya Barron noted her life was "so planned for me". Tanya Barron noted the difficulty Tanya Barron experiences to plan and initiate tasks and noted having a difficulty time once starting college. Tanya Barron described her mother as manipulative, controlling, and attention seeking. We processed this during the session. Tanya Barron noted her intention to increase her exercise. We explored this and discussed the importance of setting reasonable expectations, managing self-talk, thinking positively of activity, preparation, and problem-solving. We discussed reframing self-care and thinking of the benefits of instead of restrictive feelings Tanya Barron feels. Tanya Barron was engaged and motivated during the session. Therapist provided supportive therapy.   Interventions: CBT and BA  Diagnosis:   Other specified anxiety disorders  Psychiatric Treatment: No , but is pursing treatment via [facility] [place]:  Client Abilities/Strengths Tanya Barron is intelligent, forthcoming, and motivated for change .  Support System: Daughter.   Client Treatment Preferences Outpatient therapy.   Client Statement of Needs Tanya Barron would like to more actively manage her finances, begin psychiatric treatment, improve fitness and health, reduce alcohol consumption,  spend more time outside, increase mindfulness, manage impulsivity, become more purposeful.   Treatment Level Weekly  Symptoms  Anxiety: worry, difficulty managing worry, worrying about different things, trouble relaxing, restlessness, irritability, feeling afraid something awful might happen.  (Status: maintained) Depression, loss of interest, feeling down, lethargy, poor appetite, trouble concentrating, psychomotor agitation, denied SI   (Status: maintained)  Goals:   Aminat experiences symptoms of depression, anxiety,  interpersonal stressors, and ADHD.  Target Date: 11/02/23 Frequency: Weekly  Progress: 0 Modality: individual    Therapist will provide referrals for additional resources as appropriate.  Therapist will provide psycho-education regarding Tanya Barron's diagnosis and corresponding treatment approaches and interventions. Licensed Clinical Social Worker, [place], LCSW will support the patient's ability to achieve the goals identified. will employ CBT, BA, Problem-solving, Solution Focused, Mindfulness,  coping skills, & other evidenced-based practices will be used to promote progress towards healthy functioning to help manage decrease symptoms associated with her diagnosis.   Reduce overall level, frequency, and intensity of the feelings of depression, anxiety and panic evidenced by decreased overall symptoms from 6 to 7 days/week to 0 to 1 days/week per client report for at least 3 consecutive months. Verbally express understanding of the relationship between feelings of depression, anxiety and their impact on thinking patterns and behaviors. Verbalize an understanding of the role that distorted thinking plays in creating fears, excessive worry, and ruminations.  Tanya Barron participated in the creation of the treatment plan)

## 2023-01-08 DIAGNOSIS — N390 Urinary tract infection, site not specified: Secondary | ICD-10-CM | POA: Diagnosis not present

## 2023-01-10 ENCOUNTER — Ambulatory Visit (INDEPENDENT_AMBULATORY_CARE_PROVIDER_SITE_OTHER): Payer: Medicare HMO | Admitting: Adult Health

## 2023-01-10 ENCOUNTER — Encounter: Payer: Self-pay | Admitting: Adult Health

## 2023-01-10 VITALS — BP 120/78 | HR 96 | Ht 59.0 in | Wt 134.0 lb

## 2023-01-10 DIAGNOSIS — F411 Generalized anxiety disorder: Secondary | ICD-10-CM

## 2023-01-10 DIAGNOSIS — F1010 Alcohol abuse, uncomplicated: Secondary | ICD-10-CM

## 2023-01-10 DIAGNOSIS — F331 Major depressive disorder, recurrent, moderate: Secondary | ICD-10-CM | POA: Diagnosis not present

## 2023-01-10 DIAGNOSIS — R69 Illness, unspecified: Secondary | ICD-10-CM | POA: Diagnosis not present

## 2023-01-10 DIAGNOSIS — F410 Panic disorder [episodic paroxysmal anxiety] without agoraphobia: Secondary | ICD-10-CM

## 2023-01-10 NOTE — Progress Notes (Signed)
[facility] MD/PA/NP Initial Note  01/10/2023 11:07 AM Tanya Barron  MRN:  [id number]  Chief Complaint:   HPI:   Patient seen today for initial psychiatric evaluation.   Referred by [name].  Describes mood today as "ok". Mood symptoms - denies depression - "sometimes". Reports anxiety involving financial and situational stressors. Denies irritability. Denies worry, rumination, and over thinking. Denies obsessive thoughts or acts. Mood is consistent. Stating "I det a high bar for myself". Has been taking Paxil '60mg'$  for 40 years and feels like it is helpful. Wanting to discuss attentions issues - feels like she has difficulties for several years. Has difficulties starting tasks and completing tasks.Reports procrastination. Not finishing tasks that she starts. Feels like cleaning is a "major" project. Has projects she wants to do and then stops after starting them. Reports struggling with math throughout school - struggled with math. Stating "I was a comfortable student". Participated in gymnastics, math and art. Struggled with math. Majored more in the creative arts. Completed her master's degree. Has worked as an Consulting civil engineer. Would like to be screened, and treated for ADD. Stable interest and motivation. Taking medications as prescribed.  Energy levels stable. Active, exercises some. Walking and swimming.   Enjoys some usual interests and activities. Divorced. Lives alone with daughter - 34.  Her son and his family is local - 20 y/o. Spending time with family. Appetite adequate. Weight stable 134 pounds. Sleeps well most nights - up and down 2 to 3 times during the night. Averages 8 to 9 hours. Focus and concentration stable. Completing tasks. Managing aspects of household. 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 Attended graduate school.  Denies  SI or HI.  Denies AH or VH. Denies self harm. Reports alcohol abuse - drinking 6 beers a night - discontinued 8 days ago - denies any current withdrawal symptoms. She and son having some difficulties - related to her drinking.  Previous medication trials: One other - unknown  Visit Diagnosis:    ICD-10-CM   1. Major depressive disorder, recurrent episode, moderate (HCC)  F33.1     2. Panic attacks  F41.0     3. Generalized anxiety disorder  F41.1     4. Alcohol abuse  F10.10       Past Psychiatric History: Denies psychiatric hospitalization.   Past Medical History:  Past Medical History:  Diagnosis Date   HTN (hypertension)    Seasonal allergies     Past Surgical History:  Procedure Laterality Date   CARPAL TUNNEL RELEASE     SPINAL FUSION     TRIGGER FINGER RELEASE      Family Psychiatric History: Family history of mental illness - mood disorder - alcohol.   Family History:  Family History  Problem Relation Age of Onset   Alzheimer's disease Mother    Heart disease Father     Social History:  Social History   Socioeconomic History   Marital status: Divorced    Spouse name: Not on file   Number of children: Not on file   Years of education: Not on file   Highest education level: Not on file  Occupational History   Not on file  Tobacco Use   Smoking status: Former    Packs/day: 1.00    Years: 12.00    Total pack years: 12.00  Types: Cigarettes    Quit date: 27    Years since quitting: 39.1   Smokeless tobacco: Never  Substance and Sexual Activity   Alcohol use: Yes   Drug use: Not on file   Sexual activity: Not on file  Other Topics Concern   Not on file  Social History Narrative   Not on file   Social Determinants of Health   Financial Resource Strain: Not on file  Food Insecurity: Not on file  Transportation Needs: Not on file  Physical Activity: Not on file  Stress: Not on file  Social Connections: Not on file    Allergies:   Allergies  Allergen Reactions   Sulfamethoxazole-Trimethoprim Hives    Metabolic Disorder Labs: No results found for: "HGBA1C", "MPG" No results found for: "PROLACTIN" No results found for: "CHOL", "TRIG", "HDL", "CHOLHDL", "VLDL", "LDLCALC" No results found for: "TSH"  Therapeutic Level Labs: No results found for: "LITHIUM" No results found for: "VALPROATE" No results found for: "CBMZ"  Current Medications: Current Outpatient Medications  Medication Sig Dispense Refill   alendronate (FOSAMAX) 70 MG tablet Take 70 mg by mouth once a week.     amLODipine (NORVASC) 5 MG tablet      aspirin EC 81 MG tablet Take 1 tablet (81 mg total) by mouth daily. Swallow whole. 90 tablet 3   atorvastatin (LIPITOR) 40 MG tablet Take 1 tablet by mouth daily.     cetirizine (ZYRTEC) 10 MG tablet Take by mouth.     pantoprazole (PROTONIX) 40 MG tablet Take 1 tablet by mouth daily.     PARoxetine (PAXIL) 40 MG tablet Take 60 mg by mouth daily.     triamterene-hydrochlorothiazide (DYAZIDE) 37.5-25 MG capsule      No current facility-administered medications for this visit.    Medication Side Effects: none  Orders placed this visit:  No orders of the defined types were placed in this encounter.   Psychiatric Specialty Exam:  Review of Systems  Musculoskeletal:  Negative for gait problem.  Neurological:  Negative for tremors.  Psychiatric/Behavioral:         Please refer to HPI    Blood pressure 120/78, pulse 96, height '4\' 11"'$  (1.499 m), weight 134 lb (60.8 kg).Body mass index is 27.06 kg/m.  General Appearance: Casual and Neat  Eye Contact:  Good  Speech:  Clear and Coherent and Normal Rate  Volume:  Normal  Mood:  Euthymic  Affect:  Appropriate and Congruent  Thought Process:  Coherent and Descriptions of Associations: Intact  Orientation:  Full (Time, Place, and Person)  Thought Content: Logical   Suicidal Thoughts:  No  Homicidal Thoughts:  No  Memory:  WNL  Judgement:  Good   Insight:  Good  Psychomotor Activity:  Normal  Concentration:  Concentration: Good and Attention Span: Good  Recall:  Good  Fund of Knowledge: Good  Language: Good  Assets:  Communication Skills Desire for Improvement Financial Resources/Insurance Housing Intimacy Leisure Time Physical Health Resilience Social Support Talents/Skills Transportation Vocational/Educational  ADL's:  Intact  Cognition: WNL  Prognosis:  Good   Screenings: MDQ  Receiving Psychotherapy: Yes   Treatment Plan/Recommendations:   Plan:  PDMP reviewed  Continue Paxil '60mg'$  daily x 40 years  Discussed Naltrexone for alcohol use.  Plan to screen for ADD - screening tools given.  RTC 4 weeks  Patient advised to contact office with any questions, adverse effects, or acute worsening in signs and symptoms.   Aloha Gell, NP

## 2023-01-12 DIAGNOSIS — M25512 Pain in left shoulder: Secondary | ICD-10-CM | POA: Diagnosis not present

## 2023-01-17 ENCOUNTER — Ambulatory Visit (INDEPENDENT_AMBULATORY_CARE_PROVIDER_SITE_OTHER): Payer: Medicare HMO | Admitting: Psychology

## 2023-01-17 ENCOUNTER — Ambulatory Visit: Payer: Medicare HMO | Admitting: Psychology

## 2023-01-17 DIAGNOSIS — F331 Major depressive disorder, recurrent, moderate: Secondary | ICD-10-CM

## 2023-01-17 DIAGNOSIS — R69 Illness, unspecified: Secondary | ICD-10-CM | POA: Diagnosis not present

## 2023-01-17 DIAGNOSIS — F411 Generalized anxiety disorder: Secondary | ICD-10-CM

## 2023-01-17 NOTE — Progress Notes (Signed)
[place] Counselor/Therapist Progress Note  Patient ID: Tanya Barron, MRN: [id number]    Date: 01/17/23  Time Spent: 9:08  am - 9:58 am : 50 Minutes  Treatment Type: Individual Therapy.  Reported Symptoms: Depression, anxiety, ADHD, and interpersonal stressors.   Mental Status Exam: Appearance:  Casual     Behavior: Appropriate  Motor: Normal  Speech/Language:  Clear and Coherent  Affect: Appropriate  Mood: dysthymic  Thought process: normal  Thought content:   WNL  Sensory/Perceptual disturbances:   WNL  Orientation: oriented to person, place, time/date, and situation  Attention: Good  Concentration: Good  Memory: WNL  Fund of knowledge:  Good  Insight:   Good  Judgment:  Good  Impulse Control: Good   Risk Assessment: Danger to Self:  No Self-injurious Behavior: No Danger to Others: No Duty to Warn:no Physical Aggression / Violence:No  Access to Firearms a concern: No  Gang Involvement:No   Subjective:   Tanya Barron participated in the session, in person in the office with the therapist, and consented to treatment. Tanya Barron reviewed the events of the past week. She met with her psychiatric provider, Tanya Barron, for her initial consult. She noted having an upcoming appointment on 02/07/23. She noted having a positive initial meeting. She noted discussed her possible ADHD symptoms and alcohol consumption. She did note being two weeks sober during the session, which therapist praised. She noted her attempts to include her son in a recent friend visit and receiving a negative response to her thoughtful invite. We processed this during the session. She denied any "shakes" or tremors. She noted being more attuned and present. She noted drinking juices at night. She noted often drinking on an empty stomach and often noted this being an alternative to eating. Therapist encouraged Tanya Barron to identify additional triggers and contributing factors to her drinking  and to create a list to be further processed. She noted a maternal family history of alcohol use but denied any on her father's side. She noted her intent to maintain her alcohol free approach for the next 4 weeks. We explored this during the session. Therapist encouraged Tanya Barron to identify her values and the behaviors that would align with this. Therapist encouraged Tanya Barron to work towards flexibility regarding this time-line and to process what it would be like to be sober long-term. Tanya Barron was engaged and motivated during the session and discussed her interest in following through on session treatment goals. Therapist validated and normalized Tanya Barron's experience and provided supportive therapy.   Interventions: CBT and BA  Diagnosis:   Major depressive disorder, recurrent episode, moderate (HCC)  Generalized anxiety disorder  Psychiatric Treatment: No , but is pursing treatment via [facility]:  Client Abilities/Strengths Tanya Barron is intelligent, forthcoming, and motivated for change .  Support System: Daughter.   Client Treatment Preferences Outpatient therapy.   Client Statement of Needs Tanya Barron would like to more actively manage her finances, begin psychiatric treatment, improve fitness and health, reduce alcohol consumption,  spend more time outside, increase mindfulness, manage impulsivity, become more purposeful.   Treatment Level Weekly  Symptoms  Anxiety: worry, difficulty managing worry, worrying about different things, trouble relaxing, restlessness, irritability, feeling afraid something awful might happen.  (Status: maintained) Depression, loss of interest, feeling down, lethargy, poor appetite, trouble concentrating, psychomotor agitation, denied SI   (Status: maintained)  Goals:   Tanya Barron experiences symptoms of depression, anxiety, interpersonal stressors, and ADHD.    Target Date: 11/02/23 Frequency: Weekly  Progress: 0  Modality:  individual    Therapist will provide referrals for additional resources as appropriate.  Therapist will provide psycho-education regarding Adali's diagnosis and corresponding treatment approaches and interventions. Licensed Clinical Social Worker, [place], LCSW will support the patient's ability to achieve the goals identified. will employ CBT, BA, Problem-solving, Solution Focused, Mindfulness,  coping skills, & other evidenced-based practices will be used to promote progress towards healthy functioning to help manage decrease symptoms associated with her diagnosis.   Reduce overall level, frequency, and intensity of the feelings of depression, anxiety and panic evidenced by decreased overall symptoms from 6 to 7 days/week to 0 to 1 days/week per client report for at least 3 consecutive months. Verbally express understanding of the relationship between feelings of depression, anxiety and their impact on thinking patterns and behaviors. Verbalize an understanding of the role that distorted thinking plays in creating fears, excessive worry, and ruminations.  Tanya Barron participated in the creation of the treatment plan)

## 2023-01-24 DIAGNOSIS — H524 Presbyopia: Secondary | ICD-10-CM | POA: Diagnosis not present

## 2023-01-31 ENCOUNTER — Ambulatory Visit (INDEPENDENT_AMBULATORY_CARE_PROVIDER_SITE_OTHER): Payer: Medicare HMO | Admitting: Psychology

## 2023-01-31 DIAGNOSIS — F411 Generalized anxiety disorder: Secondary | ICD-10-CM

## 2023-01-31 DIAGNOSIS — F331 Major depressive disorder, recurrent, moderate: Secondary | ICD-10-CM | POA: Diagnosis not present

## 2023-01-31 DIAGNOSIS — R69 Illness, unspecified: Secondary | ICD-10-CM | POA: Diagnosis not present

## 2023-01-31 NOTE — Progress Notes (Signed)
[facility] Counselor/Therapist Progress Note  Patient ID: Alexsandra Zettle, MRN: [id number]    Date: 01/31/23  Time Spent: 12:34 pm - 1:03 pm : 29 Minutes  Treatment Type: Individual Therapy.  Reported Symptoms: Depression, anxiety, ADHD, and interpersonal stressors.   Mental Status Exam: Appearance:  Casual     Behavior: Appropriate  Motor: Normal  Speech/Language:  Clear and Coherent  Affect: Appropriate  Mood: dysthymic  Thought process: normal  Thought content:   WNL  Sensory/Perceptual disturbances:   WNL  Orientation: oriented to person, place, time/date, and situation  Attention: Good  Concentration: Good  Memory: WNL  Fund of knowledge:  Good  Insight:   Good  Judgment:  Good  Impulse Control: Good   Risk Assessment: Danger to Self:  No Self-injurious Behavior: No Danger to Others: No Duty to Warn:no Physical Aggression / Violence:No  Access to Firearms a concern: No  Gang Involvement:No   Subjective:   [name] participated in the session, in person in the office with the therapist, and consented to treatment. Marise reviewed the events of the past week.Faythe noted breaking her alcohol fast, during lent, but could not identify a reason for this. She is hopeful to continue through the remainder of lent alcohol free. We worked on exploring this during the session. She has a pending appointment with her psychiatric provider to be assessed for ADHD. She noted continued stressors with her son and noted her attempts to not focus on his behavior and to highlights the positives. We processed recent stressors with her son, Hall Busing. . We scheduled follow-ups for continued treatment. Eraina was engaged and motivated during the session and discussed her interest in following through on session treatment goals. Therapist validated and normalized Charnel's experience and provided supportive therapy.   Interventions: CBT and BA  Diagnosis:   Major depressive  disorder, recurrent episode, moderate (HCC)  Generalized anxiety disorder  Psychiatric Treatment: No , but is pursing treatment via [facility]:  Client Abilities/Strengths Teaya is intelligent, forthcoming, and motivated for change .  Support System: Daughter.   Client Treatment Preferences Outpatient therapy.   Client Statement of Needs Benji would like to more actively manage her finances, begin psychiatric treatment, improve fitness and health, reduce alcohol consumption,  spend more time outside, increase mindfulness, manage impulsivity, become more purposeful.   Treatment Level Weekly  Symptoms  Anxiety: worry, difficulty managing worry, worrying about different things, trouble relaxing, restlessness, irritability, feeling afraid something awful might happen.  (Status: maintained) Depression, loss of interest, feeling down, lethargy, poor appetite, trouble concentrating, psychomotor agitation, denied SI   (Status: maintained)  Goals:   Shterna experiences symptoms of depression, anxiety, interpersonal stressors, and ADHD.    Target Date: 11/02/23 Frequency: Weekly  Progress: 0 Modality: individual    Therapist will provide referrals for additional resources as appropriate.  Therapist will provide psycho-education regarding Lavona's diagnosis and corresponding treatment approaches and interventions. Licensed Clinical Social Worker, [place], LCSW will support the patient's ability to achieve the goals identified. will employ CBT, BA, Problem-solving, Solution Focused, Mindfulness,  coping skills, & other evidenced-based practices will be used to promote progress towards healthy functioning to help manage decrease symptoms associated with her diagnosis.   Reduce overall level, frequency, and intensity of the feelings of depression, anxiety and panic evidenced by decreased overall symptoms from 6 to 7 days/week to 0 to 1 days/week per client  report for at least 3 consecutive months. Verbally express understanding of the relationship  between feelings of depression, anxiety and their impact on thinking patterns and behaviors. Verbalize an understanding of the role that distorted thinking plays in creating fears, excessive worry, and ruminations.  [name] participated in the creation of the treatment plan)

## 2023-02-07 ENCOUNTER — Encounter: Payer: Self-pay | Admitting: Adult Health

## 2023-02-07 ENCOUNTER — Ambulatory Visit: Payer: Medicare HMO | Admitting: Adult Health

## 2023-02-07 DIAGNOSIS — F411 Generalized anxiety disorder: Secondary | ICD-10-CM

## 2023-02-07 DIAGNOSIS — F331 Major depressive disorder, recurrent, moderate: Secondary | ICD-10-CM | POA: Diagnosis not present

## 2023-02-07 DIAGNOSIS — R69 Illness, unspecified: Secondary | ICD-10-CM | POA: Diagnosis not present

## 2023-02-07 DIAGNOSIS — F1010 Alcohol abuse, uncomplicated: Secondary | ICD-10-CM

## 2023-02-07 DIAGNOSIS — F410 Panic disorder [episodic paroxysmal anxiety] without agoraphobia: Secondary | ICD-10-CM | POA: Diagnosis not present

## 2023-02-07 NOTE — Addendum Note (Signed)
Addended by: Aloha Gell on: 02/07/2023 10:40 AM   Modules accepted: Level of Service

## 2023-02-07 NOTE — Progress Notes (Addendum)
Tanya Barron [id number] 10/18/1951 72 y.o.  Subjective:   Patient ID:  Tanya Barron is a 72 y.o. (DOB Jul 24, 1951) female.  Chief Complaint: No chief complaint on file.   HPI Tanya Barron presents to the office today for follow-up of MDD, GAD, Panic attacks, Alcohol use.   Referred by [name].  Describes mood today as "ok". Mood symptoms - denies depression - "here and there - peddling backwards". Reports anxiety - financial and situational stressors. Denies irritability. Denies worry, rumination, and over thinking. Denies obsessive thoughts or acts. Mood is consistent. Stating "I rarely ever feel like everything is great - Something always nagging at me". Continues to take Paxil 60mg  daily - has taken for 40 years.Stable interest and motivation. Taking medications as prescribed.  Energy levels "about average". Active, exercises some - walking. Enjoys some usual interests and activities. Divorced. Lives alone with daughter - 37 special needs.  Her son and his family is local - 71 y/o. Spending time with family. Appetite adequate. Weight stable 134 pounds. Sleeps well most nights. Averages 8 to 9 hours when working - 12 when not working. Focus and concentration difficulties at times. Completing tasks. Managing aspects of household. 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 Attended graduate school.  Denies SI or HI.  Denies AH or VH. Denies self harm. Reports alcohol abuse - 6 beers a night 5 nights a week.  Previous medication trials: One other - unknown  Review of Systems:  Review of Systems  Musculoskeletal:  Negative for gait problem.  Neurological:  Negative for tremors.  Psychiatric/Behavioral:         Please refer to HPI    Medications: I have reviewed the patient's current medications.  Current Outpatient Medications   Medication Sig Dispense Refill   alendronate (FOSAMAX) 70 MG tablet Take 70 mg by mouth once a week.     amLODipine (NORVASC) 5 MG tablet      aspirin EC 81 MG tablet Take 1 tablet (81 mg total) by mouth daily. Swallow whole. 90 tablet 3   atorvastatin (LIPITOR) 40 MG tablet Take 1 tablet by mouth daily.     cetirizine (ZYRTEC) 10 MG tablet Take by mouth.     pantoprazole (PROTONIX) 40 MG tablet Take 1 tablet by mouth daily.     PARoxetine (PAXIL) 40 MG tablet Take 60 mg by mouth daily.     triamterene-hydrochlorothiazide (DYAZIDE) 37.5-25 MG capsule      No current facility-administered medications for this visit.    Medication Side Effects: None  Allergies:  Allergies  Allergen Reactions   Sulfamethoxazole-Trimethoprim Hives    Past Medical History:  Diagnosis Date   HTN (hypertension)    Seasonal allergies     Past Medical History, Surgical history, Social history, and Family history were reviewed and updated as appropriate.   Please see review of systems for further details on the patient's review from today.   Objective:   Physical Exam:  There were no vitals taken for this visit.  Physical Exam Constitutional:      General: She is not in acute distress. Musculoskeletal:        General: No deformity.  Neurological:     Mental Status: She is alert and oriented to person, place, and time.     Coordination: Coordination normal.  Psychiatric:  Attention and Perception: Attention and perception normal. She does not perceive auditory or visual hallucinations.        Mood and Affect: Mood normal. Mood is not anxious or depressed. Affect is not labile, blunt, angry or inappropriate.        Speech: Speech normal.        Behavior: Behavior normal.        Thought Content: Thought content normal. Thought content is not paranoid or delusional. Thought content does not include homicidal or suicidal ideation. Thought content does not include homicidal or suicidal plan.         Cognition and Memory: Cognition and memory normal.        Judgment: Judgment normal.     Comments: Insight intact   Lab Review:  No results found for: "NA", "K", "CL", "CO2", "GLUCOSE", "BUN", "CREATININE", "CALCIUM", "PROT", "ALBUMIN", "AST", "ALT", "ALKPHOS", "BILITOT", "GFRNONAA", "GFRAA"  No results found for: "WBC", "RBC", "HGB", "HCT", "PLT", "MCV", "MCH", "MCHC", "RDW", "LYMPHSABS", "MONOABS", "EOSABS", "BASOSABS"  No results found for: "POCLITH", "LITHIUM"   No results found for: "PHENYTOIN", "PHENOBARB", "VALPROATE", "CBMZ"   .res Assessment: Plan:     Plan:  PDMP reviewed  Continue Paxil 60mg  daily x 40 years  Discussed Naltrexone for alcohol use.  Discussed ADD - unable to offer treatment with current alcohol use.  RTC 3 months  Working with therapist   Patient advised to contact office with any questions, adverse effects, or acute worsening in signs and symptoms.  Diagnoses and all orders for this visit:  Major depressive disorder, recurrent episode, moderate (HCC)  Generalized anxiety disorder  Panic attacks  Alcohol abuse     Please see After Visit Summary for patient specific instructions.  Future Appointments  Date Time Provider [place]  02/15/2023  3:00 PM [name], LCSW [id number] None    No orders of the defined types were placed in this encounter.   -------------------------------

## 2023-02-15 ENCOUNTER — Ambulatory Visit (INDEPENDENT_AMBULATORY_CARE_PROVIDER_SITE_OTHER): Payer: Medicare HMO | Admitting: Psychology

## 2023-02-15 DIAGNOSIS — F411 Generalized anxiety disorder: Secondary | ICD-10-CM | POA: Diagnosis not present

## 2023-02-15 DIAGNOSIS — F331 Major depressive disorder, recurrent, moderate: Secondary | ICD-10-CM | POA: Diagnosis not present

## 2023-02-15 DIAGNOSIS — R69 Illness, unspecified: Secondary | ICD-10-CM | POA: Diagnosis not present

## 2023-02-15 NOTE — Progress Notes (Signed)
[facility] Counselor/Therapist Progress Note  Patient ID: Tanya Barron, MRN: [id number]    Date: 02/15/23  Time Spent: 3:08 pm - 3:54 pm : 46 Minutes  Treatment Type: Individual Therapy.  Reported Symptoms: Depression, anxiety, ADHD, and interpersonal stressors.   Mental Status Exam: Appearance:  Casual     Behavior: Appropriate  Motor: Normal  Speech/Language:  Clear and Coherent  Affect: Appropriate  Mood: dysthymic  Thought process: normal  Thought content:   WNL  Sensory/Perceptual disturbances:   WNL  Orientation: oriented to person, place, time/date, and situation  Attention: Good  Concentration: Good  Memory: WNL  Fund of knowledge:  Good  Insight:   Good  Judgment:  Good  Impulse Control: Good   Risk Assessment: Danger to Self:  No Self-injurious Behavior: No Danger to Others: No Duty to Warn:no Physical Aggression / Violence:No  Access to Firearms a concern: No  Gang Involvement:No   Subjective:   Tanya Barron participated in the session, in person in the office with the therapist, and consented to treatment. Tanya Barron reviewed the events of the past week. She noted meeting with her psychiatric provider regarding her ADHD and noted a lack of clarity, overall. Tanya Barron noted her provided suggesting possible medications including Atomoxetine, separately, Naltrexone, for inattention and alcohol use, respectively.  We explored this during the session and her alcohol use, in general, and her thoughts and interest in discontinuing drinking. Therapist encouraged Tanya Barron to take inventory of her alcohol use, the possible effects in various areas of her life including relationships, health, mental health, socially, and any other areas. Tanya Barron noted her procrastination and we worked on identifying being more purposeful and planning this and being more proactive. She noted her intent to work on this and research 12 step groups as well. Therapist praised Tanya Barron  for her effort during the session and her intent to work on these goals between sessions. Therapist validated and normalized Tanya Barron experience and provided supportive therapy.   Interventions: CBT and BA  Diagnosis:   Major depressive disorder, recurrent episode, moderate  Generalized anxiety disorder  Psychiatric Treatment: No , but is pursing treatment via [facility]:  Client Abilities/Strengths Tanya Barron is intelligent, forthcoming, and motivated for change .  Support System: Daughter.   Client Treatment Preferences Outpatient therapy.   Client Statement of Needs Tanya Barron would like to more actively manage her finances, begin psychiatric treatment, improve fitness and health, reduce alcohol consumption,  spend more time outside, increase mindfulness, manage impulsivity, become more purposeful.   Treatment Level Weekly  Symptoms  Anxiety: worry, difficulty managing worry, worrying about different things, trouble relaxing, restlessness, irritability, feeling afraid something awful might happen.  (Status: maintained) Depression, loss of interest, feeling down, lethargy, poor appetite, trouble concentrating, psychomotor agitation, denied SI   (Status: maintained)  Goals:   Tanya Barron experiences symptoms of depression, anxiety, interpersonal stressors, and ADHD.    Target Date: 11/02/23 Frequency: Weekly  Progress: 0 Modality: individual    Therapist will provide referrals for additional resources as appropriate.  Therapist will provide psycho-education regarding Tanya Barron's diagnosis and corresponding treatment approaches and interventions. Licensed Clinical Social Worker, [place], LCSW will support the patient's ability to achieve the goals identified. will employ CBT, BA, Problem-solving, Solution Focused, Mindfulness,  coping skills, & other evidenced-based practices will be used to promote progress towards healthy functioning to help manage  decrease symptoms associated with her diagnosis.   Reduce overall level, frequency, and intensity of the feelings of depression, anxiety and  panic evidenced by decreased overall symptoms from 6 to 7 days/week to 0 to 1 days/week per client report for at least 3 consecutive months. Verbally express understanding of the relationship between feelings of depression, anxiety and their impact on thinking patterns and behaviors. Verbalize an understanding of the role that distorted thinking plays in creating fears, excessive worry, and ruminations.  Tanya Barron participated in the creation of the treatment plan)

## 2023-03-01 ENCOUNTER — Ambulatory Visit (INDEPENDENT_AMBULATORY_CARE_PROVIDER_SITE_OTHER): Payer: Medicare HMO | Admitting: Psychology

## 2023-03-01 DIAGNOSIS — R69 Illness, unspecified: Secondary | ICD-10-CM | POA: Diagnosis not present

## 2023-03-01 DIAGNOSIS — F331 Major depressive disorder, recurrent, moderate: Secondary | ICD-10-CM

## 2023-03-01 DIAGNOSIS — F411 Generalized anxiety disorder: Secondary | ICD-10-CM | POA: Diagnosis not present

## 2023-03-01 NOTE — Progress Notes (Signed)
[facility] Counselor/Therapist Progress Note  Patient ID: Tanya Barron, MRN: [id number]    Date: 03/01/23  Time Spent: 2:48 pm - 3:32 pm :44 Minutes  Treatment Type: Individual Therapy.  Reported Symptoms: Depression, anxiety, ADHD, and interpersonal stressors.   Mental Status Exam: Appearance:  Casual     Behavior: Appropriate  Motor: Normal  Speech/Language:  Clear and Coherent  Affect: Appropriate  Mood: dysthymic  Thought process: normal  Thought content:   WNL  Sensory/Perceptual disturbances:   WNL  Orientation: oriented to person, place, time/date, and situation  Attention: Good  Concentration: Good  Memory: WNL  Fund of knowledge:  Good  Insight:   Good  Judgment:  Good  Impulse Control: Good   Risk Assessment: Danger to Self:  No Self-injurious Behavior: No Danger to Others: No Duty to Warn:no Physical Aggression / Violence:No  Access to Firearms a concern: No  Gang Involvement:No   Subjective:   Tanya Barron participated in the session, in person in the office with the therapist, and consented to treatment. Tanya Barron reviewed the events of the past week. Tanya Barron  noted things being "bleh and boring" during the past few weeks and noted her interest in scheduling a follow-up with her psychiatric provider. She noted difficulty getting started on tasks and has not done her research into a medication that her prescriber mentioned during their last appointment. She noted all she wants to do is "watch tv and drink beer" and noted poor engagement in other activities. 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 She noted worry about aging and noted her mother's experience with Alzheimer's. She noted "I am almost 72 and what do I have show for it?" We explored this during the session. She noted needing to feel productive We  worked on processing this and therapist encouraged Tanya Barron to review Tanya Barron stages of development. Therapist encouraged Tanya Barron to create a list of tasks that are meaningful to her throughout her life to be processed going forward. Therapist praised Tanya Barron for her effort during the session and her intent to work on these goals between sessions. She will work on contacting her psychiatric provider, as well. Therapist validated and normalized Tanya Barron's experience and provided supportive therapy.   Interventions: CBT and BA  Diagnosis:   Major depressive disorder, recurrent episode, moderate  Generalized anxiety disorder  Psychiatric Treatment: No , but is pursing treatment via [facility]  Treatment Plan:  Client Abilities/Strengths Tanya Barron is intelligent, forthcoming, and motivated for change .  Support System: Daughter.   Client Treatment Preferences Outpatient therapy.   Client Statement of Needs Tanya Barron would like to more actively manage her finances, begin psychiatric treatment, improve fitness and health, reduce alcohol consumption,  spend more time outside, increase mindfulness, manage impulsivity, become more purposeful.   Treatment Level Weekly  Symptoms  Anxiety: worry, difficulty managing worry, worrying about different things, trouble relaxing, restlessness, irritability, feeling afraid something awful might happen.  (Status: maintained) Depression, loss of interest, feeling down, lethargy, poor appetite, trouble concentrating, psychomotor agitation, denied SI   (Status: maintained)  Goals:   Tanya Barron experiences symptoms of depression, anxiety, interpersonal stressors, and ADHD.    Target Date: 11/02/23 Frequency: Weekly  Progress: 0 Modality: individual    Therapist will provide referrals for additional resources as appropriate.  Therapist will provide psycho-education regarding Tanya Barron's diagnosis and corresponding treatment approaches and  interventions. Licensed Clinical  Social Worker, Tanya Ovens, LCSW will support the patient's ability to achieve the goals identified. will employ CBT, BA, Problem-solving, Solution Focused, Mindfulness,  coping skills, & other evidenced-based practices will be used to promote progress towards healthy functioning to help manage decrease symptoms associated with her diagnosis.   Reduce overall level, frequency, and intensity of the feelings of depression, anxiety and panic evidenced by decreased overall symptoms from 6 to 7 days/week to 0 to 1 days/week per client report for at least 3 consecutive months. Verbally express understanding of the relationship between feelings of depression, anxiety and their impact on thinking patterns and behaviors. Verbalize an understanding of the role that distorted thinking plays in creating fears, excessive worry, and ruminations.  Tanya Barron participated in the creation of the treatment plan)

## 2023-03-14 ENCOUNTER — Ambulatory Visit: Payer: Medicare HMO | Admitting: Psychology

## 2023-03-25 DIAGNOSIS — Z0100 Encounter for examination of eyes and vision without abnormal findings: Secondary | ICD-10-CM | POA: Diagnosis not present

## 2023-03-28 ENCOUNTER — Ambulatory Visit: Payer: Medicare HMO | Admitting: Psychology

## 2023-03-28 DIAGNOSIS — F331 Major depressive disorder, recurrent, moderate: Secondary | ICD-10-CM

## 2023-03-28 DIAGNOSIS — E559 Vitamin D deficiency, unspecified: Secondary | ICD-10-CM | POA: Diagnosis not present

## 2023-03-28 DIAGNOSIS — Z23 Encounter for immunization: Secondary | ICD-10-CM | POA: Diagnosis not present

## 2023-03-28 DIAGNOSIS — F411 Generalized anxiety disorder: Secondary | ICD-10-CM | POA: Diagnosis not present

## 2023-03-28 DIAGNOSIS — E785 Hyperlipidemia, unspecified: Secondary | ICD-10-CM | POA: Diagnosis not present

## 2023-03-28 DIAGNOSIS — M810 Age-related osteoporosis without current pathological fracture: Secondary | ICD-10-CM | POA: Diagnosis not present

## 2023-03-28 DIAGNOSIS — I10 Essential (primary) hypertension: Secondary | ICD-10-CM | POA: Diagnosis not present

## 2023-03-28 DIAGNOSIS — I700 Atherosclerosis of aorta: Secondary | ICD-10-CM | POA: Diagnosis not present

## 2023-03-28 DIAGNOSIS — Z79899 Other long term (current) drug therapy: Secondary | ICD-10-CM | POA: Diagnosis not present

## 2023-03-28 DIAGNOSIS — R7303 Prediabetes: Secondary | ICD-10-CM | POA: Diagnosis not present

## 2023-03-28 DIAGNOSIS — Z0000 Encounter for general adult medical examination without abnormal findings: Secondary | ICD-10-CM | POA: Diagnosis not present

## 2023-03-28 DIAGNOSIS — I2510 Atherosclerotic heart disease of native coronary artery without angina pectoris: Secondary | ICD-10-CM | POA: Diagnosis not present

## 2023-03-28 DIAGNOSIS — N390 Urinary tract infection, site not specified: Secondary | ICD-10-CM | POA: Diagnosis not present

## 2023-03-28 DIAGNOSIS — F339 Major depressive disorder, recurrent, unspecified: Secondary | ICD-10-CM | POA: Diagnosis not present

## 2023-03-28 NOTE — Progress Notes (Signed)
[facility] Counselor/Therapist Progress Note  Patient ID: Tanya Barron, MRN: [id number]    Date: 03/28/23  Time Spent: 10:11 am - 11:00 pm :49 Minutes  Treatment Type: Individual Therapy.  Reported Symptoms: Depression, anxiety, ADHD, and interpersonal stressors.   Mental Status Exam: Appearance:  Casual     Behavior: Appropriate  Motor: Normal  Speech/Language:  Clear and Coherent  Affect: Appropriate  Mood: dysthymic  Thought process: normal  Thought content:   WNL  Sensory/Perceptual disturbances:   WNL  Orientation: oriented to person, place, time/date, and situation  Attention: Good  Concentration: Good  Memory: WNL  Fund of knowledge:  Good  Insight:   Good  Judgment:  Good  Impulse Control: Good   Risk Assessment: Danger to Self:  No Self-injurious Behavior: No Danger to Others: No Duty to Warn:no Physical Aggression / Violence:No  Access to Firearms a concern: No  Gang Involvement:No   Subjective:   [name] participated in the session, in person in the office with the therapist, and consented to treatment. Harlequin reviewed the events of the past week. She noted being in pain due to her right hip and noted her intent to follow-up with her provider regarding this. She noted having a follow-up next month with her psychiatric provider. She noted her past enjoyable activities including artwork, playing the piano, swimming, and writing. She noted her free time being occupied by the trump trial.  She typically wakes 9 -10 (10.5 hours of sleep), brushes teeth, feeds cats, clean litter box, coffee, clean kitchen, household tasks, grocery shopping, etc. She noted feeling an internal "nag" when she does not exercise but difficulty engaging in exercise. She identified barriers including commitments, her daughter complaining  about wanting to leave the pool, the daunting nature of a creating a routine, lack of drive, and hypersomnia. We discussed the  overuse of smart phones in the AM and the effects on attention and concentration. Therapist encouraged Arletha to work on reframing her goals, setting reasonable expectations, and challenging negative self-talk. Therapist modeled his during the session. Therapist validated and normalized Analiyah's experience and provided supportive therapy.   Interventions: CBT and BA  Diagnosis:   Generalized anxiety disorder  Major depressive disorder, recurrent episode, moderate (HCC)  Psychiatric Treatment: No , but is pursing treatment via [facility]  Treatment Plan:  Client Abilities/Strengths Ixayana is intelligent, forthcoming, and motivated for change .  Support System: Daughter.   Client Treatment Preferences Outpatient therapy.   Client Statement of Needs Shaunette would like to more actively manage her finances, begin psychiatric treatment, improve fitness and health, reduce alcohol consumption,  spend more time outside, increase mindfulness, manage impulsivity, become more purposeful.   Treatment Level Weekly  Symptoms  Anxiety: worry, difficulty managing worry, worrying about different things, trouble relaxing, restlessness, irritability, feeling afraid something awful might happen.  (Status: maintained) Depression, loss of interest, feeling down, lethargy, poor appetite, trouble concentrating, psychomotor agitation, denied SI   (Status: maintained)  Goals:   Liannah experiences symptoms of depression, anxiety, interpersonal stressors, and ADHD.    Target Date: 11/02/23 Frequency: Weekly  Progress: 0 Modality: individual    Therapist will provide referrals for additional resources as appropriate.  Therapist will provide psycho-education regarding Emersynn's diagnosis and corresponding treatment approaches and interventions. Licensed Clinical Social Worker, [place], LCSW will support the patient's ability to achieve the goals identified. will employ CBT, BA,  Problem-solving, Solution Focused, Mindfulness,  coping skills, & other evidenced-based practices will be used to promote  progress towards healthy functioning to help manage decrease symptoms associated with her diagnosis.   Reduce overall level, frequency, and intensity of the feelings of depression, anxiety and panic evidenced by decreased overall symptoms from 6 to 7 days/week to 0 to 1 days/week per client report for at least 3 consecutive months. Verbally express understanding of the relationship between feelings of depression, anxiety and their impact on thinking patterns and behaviors. Verbalize an understanding of the role that distorted thinking plays in creating fears, excessive worry, and ruminations.  [name] participated in the creation of the treatment plan)

## 2023-03-29 DIAGNOSIS — M47816 Spondylosis without myelopathy or radiculopathy, lumbar region: Secondary | ICD-10-CM | POA: Diagnosis not present

## 2023-04-12 DIAGNOSIS — M47816 Spondylosis without myelopathy or radiculopathy, lumbar region: Secondary | ICD-10-CM | POA: Diagnosis not present

## 2023-04-25 ENCOUNTER — Ambulatory Visit: Payer: Medicare HMO | Admitting: Psychology

## 2023-04-25 DIAGNOSIS — F411 Generalized anxiety disorder: Secondary | ICD-10-CM | POA: Diagnosis not present

## 2023-04-25 DIAGNOSIS — F331 Major depressive disorder, recurrent, moderate: Secondary | ICD-10-CM

## 2023-04-25 NOTE — Progress Notes (Signed)
[facility] Counselor/Therapist Progress Note  Patient ID: Tanya Barron, MRN: [id number]    Date: 04/25/23  Time Spent: 10:08 am - 10:54 pm :46 Minutes  Treatment Type: Individual Therapy.  Reported Symptoms: Depression, anxiety, ADHD, and interpersonal stressors.   Mental Status Exam: Appearance:  Casual     Behavior: Appropriate  Motor: Normal  Speech/Language:  Clear and Coherent  Affect: Appropriate  Mood: dysthymic  Thought process: normal  Thought content:   WNL  Sensory/Perceptual disturbances:   WNL  Orientation: oriented to person, place, time/date, and situation  Attention: Good  Concentration: Good  Memory: WNL  Fund of knowledge:  Good  Insight:   Good  Judgment:  Good  Impulse Control: Good   Risk Assessment: Danger to Self:  No Self-injurious Behavior: No Danger to Others: No Duty to Warn:no Physical Aggression / Violence:No  Access to Firearms a concern: No  Gang Involvement:No   Subjective:   Tanya Barron participated in the session, in person in the office with the therapist, and consented to treatment. Tanya Barron reviewed the events of the past week. She noted her upcoming psychiatric appointment for June 25th to discuss her issues with attention and concentration. She noted difficult with time management, task prioritization, low motivation, difficulty staying organized, "always being late", & difficulty making decisions. She noted her hope to discuss her attention but noted the need to discontinue alcohol consumption. She noted her experience in 12 step-programs and wondered if this type of group was "for her". We processed this and Tanya Barron noted that she has attended only one meeting and that this might not me reflective of the experience as a whole. We discussed resources for local meeting and attending 5 different meetings and reassessing after this. Tanya Barron was engaged and motivated during the session. She expressed commitment towards her  goals. Therapist provided psycho-education regarding ADHD and Alcohol use. A follow-up was scheduled for continued treatment.   Interventions: CBT and BA  Diagnosis:   Generalized anxiety disorder  Major depressive disorder, recurrent episode, moderate (HCC)  Psychiatric Treatment: No , but is pursing treatment via [facility]  Treatment Plan:  Client Abilities/Strengths Tanya Barron is intelligent, forthcoming, and motivated for change .  Support System: Daughter.   Client Treatment Preferences Outpatient therapy.   Client Statement of Needs Tanya Barron would like to more actively manage her finances, begin psychiatric treatment, improve fitness and health, reduce alcohol consumption,  spend more time outside, increase mindfulness, manage impulsivity, become more purposeful.   Treatment Level Weekly  Symptoms  Anxiety: worry, difficulty managing worry, worrying about different things, trouble relaxing, restlessness, irritability, feeling afraid something awful might happen.  (Status: maintained) Depression, loss of interest, feeling down, lethargy, poor appetite, trouble concentrating, psychomotor agitation, denied SI   (Status: maintained)  Goals:   Tanya Barron experiences symptoms of depression, anxiety, interpersonal stressors, and ADHD.    Target Date: 11/02/23 Frequency: Weekly  Progress: 0 Modality: individual    Therapist will provide referrals for additional resources as appropriate.  Therapist will provide psycho-education regarding Tanya Barron's diagnosis and corresponding treatment approaches and interventions. Licensed Clinical Social Worker, [place], LCSW will support the patient's ability to achieve the goals identified. will employ CBT, BA, Problem-solving, Solution Focused, Mindfulness,  coping skills, & other evidenced-based practices will be used to promote progress towards healthy functioning to help manage decrease symptoms associated with her diagnosis.    Reduce overall level, frequency, and intensity of the feelings of depression, anxiety and panic evidenced by decreased overall symptoms from  6 to 7 days/week to 0 to 1 days/week per client report for at least 3 consecutive months. Verbally express understanding of the relationship between feelings of depression, anxiety and their impact on thinking patterns and behaviors. Verbalize an understanding of the role that distorted thinking plays in creating fears, excessive worry, and ruminations.  Tanya Barron participated in the creation of the treatment plan)  [name], LCSW

## 2023-05-10 ENCOUNTER — Ambulatory Visit (INDEPENDENT_AMBULATORY_CARE_PROVIDER_SITE_OTHER): Payer: Medicare HMO | Admitting: Adult Health

## 2023-05-10 ENCOUNTER — Encounter: Payer: Self-pay | Admitting: Adult Health

## 2023-05-10 DIAGNOSIS — F411 Generalized anxiety disorder: Secondary | ICD-10-CM | POA: Diagnosis not present

## 2023-05-10 DIAGNOSIS — F410 Panic disorder [episodic paroxysmal anxiety] without agoraphobia: Secondary | ICD-10-CM | POA: Diagnosis not present

## 2023-05-10 DIAGNOSIS — F900 Attention-deficit hyperactivity disorder, predominantly inattentive type: Secondary | ICD-10-CM | POA: Diagnosis not present

## 2023-05-10 DIAGNOSIS — F1010 Alcohol abuse, uncomplicated: Secondary | ICD-10-CM

## 2023-05-10 DIAGNOSIS — F331 Major depressive disorder, recurrent, moderate: Secondary | ICD-10-CM

## 2023-05-10 MED ORDER — ATOMOXETINE HCL 18 MG PO CAPS: 18.0000 mg | ORAL_CAPSULE | Freq: Every day | ORAL | 2 refills | Status: DC

## 2023-05-10 NOTE — Progress Notes (Signed)
Tanya Barron [id number] Apr 20, 1951 72 y.o.  Subjective:   Patient ID:  Tanya Barron is a 72 y.o. (DOB November 23, 1950) female.  Chief Complaint: No chief complaint on file.   HPI Tanya Barron presents to the office today for follow-up of MDD, GAD, Panic attacks, Alcohol use.   Referred by [name].  Describes mood today as "ok". Mood symptoms - reports depression, anxiety, and irritability - "sometimes". Reports some worry, rumination, and over thinking. Denies obsessive thoughts or acts. Mood is consistent. Stating "I feel like I'm doing ok". Feels like medication is helpful. Continues to take Paxil 60mg  daily - has taken for 40 years. Stable interest and motivation. Taking medications as prescribed.  Energy levels "ok". Active, does not have a regular exercise routine. Enjoys some usual interests and activities. Divorced. Lives alone with daughter - 41 special needs.  Her son and his family is local - 33 y/o. Spending time with family. Appetite adequate. Weight stable 134 pounds. Sleeps well most nights. Averages 10 to 12 hours. Focus and concentration difficulties. Completing tasks. Managing aspects of household. 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 Attended graduate school.  Denies SI or HI.  Denies AH or VH. Denies self harm. Reports alcohol abuse - 6 beers a night 5 nights a week - trying to cut back.  Previous medication trials: One other - unknown  Review of Systems:  Review of Systems  Musculoskeletal:  Negative for gait problem.  Neurological:  Negative for tremors.  Psychiatric/Behavioral:         Please refer to HPI    Medications: I have reviewed the patient's current medications.  Current Outpatient Medications  Medication Sig Dispense Refill   atomoxetine (STRATTERA) 18 MG capsule Take 1 capsule (18 mg  total) by mouth daily. 30 capsule 2   alendronate (FOSAMAX) 70 MG tablet Take 70 mg by mouth once a week.     amLODipine (NORVASC) 5 MG tablet      aspirin EC 81 MG tablet Take 1 tablet (81 mg total) by mouth daily. Swallow whole. 90 tablet 3   atorvastatin (LIPITOR) 40 MG tablet Take 1 tablet by mouth daily.     cetirizine (ZYRTEC) 10 MG tablet Take by mouth.     pantoprazole (PROTONIX) 40 MG tablet Take 1 tablet by mouth daily.     PARoxetine (PAXIL) 40 MG tablet Take 60 mg by mouth daily.     triamterene-hydrochlorothiazide (DYAZIDE) 37.5-25 MG capsule      No current facility-administered medications for this visit.    Medication Side Effects: None  Allergies:  Allergies  Allergen Reactions   Sulfamethoxazole-Trimethoprim Hives    Past Medical History:  Diagnosis Date   HTN (hypertension)    Seasonal allergies     Past Medical History, Surgical history, Social history, and Family history were reviewed and updated as appropriate.   Please see review of systems for further details on the patient's review from today.   Objective:   Physical Exam:  There were no vitals taken for this visit.  Physical Exam Constitutional:      General: She is not in acute distress. Musculoskeletal:        General: No deformity.  Neurological:     Mental Status: She is alert and oriented to person, place, and time.     Coordination: Coordination  normal.  Psychiatric:        Attention and Perception: Attention and perception normal. She does not perceive auditory or visual hallucinations.        Mood and Affect: Mood normal. Mood is not anxious or depressed. Affect is not labile, blunt, angry or inappropriate.        Speech: Speech normal.        Behavior: Behavior normal.        Thought Content: Thought content normal. Thought content is not paranoid or delusional. Thought content does not include homicidal or suicidal ideation. Thought content does not include homicidal or suicidal  plan.        Cognition and Memory: Cognition and memory normal.        Judgment: Judgment normal.     Comments: Insight intact     Lab Review:  No results found for: "NA", "K", "CL", "CO2", "GLUCOSE", "BUN", "CREATININE", "CALCIUM", "PROT", "ALBUMIN", "AST", "ALT", "ALKPHOS", "BILITOT", "GFRNONAA", "GFRAA"  No results found for: "WBC", "RBC", "HGB", "HCT", "PLT", "MCV", "MCH", "MCHC", "RDW", "LYMPHSABS", "MONOABS", "EOSABS", "BASOSABS"  No results found for: "POCLITH", "LITHIUM"   No results found for: "PHENYTOIN", "PHENOBARB", "VALPROATE", "CBMZ"   .res Assessment: Plan:    Plan:  PDMP reviewed  Continue Paxil 60mg  daily x 40 years Add Stratera 18mg  daily  Discussed Naltrexone for alcohol use.  Discussed ADD - unable to offer treatment with current alcohol use.  RTC 4 weeks  Working with therapist   Patient advised to contact office with any questions, adverse effects, or acute worsening in signs and symptoms.  Diagnoses and all orders for this visit:  Major depressive disorder, recurrent episode, moderate (HCC)  Generalized anxiety disorder  Panic attacks  Alcohol abuse  Attention deficit hyperactivity disorder (ADHD), predominantly inattentive type -     atomoxetine (STRATTERA) 18 MG capsule; Take 1 capsule (18 mg total) by mouth daily.     Please see After Visit Summary for patient specific instructions.  Future Appointments  Date Time Provider Department Center  05/23/2023 10:00 AM [name], LCSW [facility] None  06/06/2023 10:00 AM [name], LCSW [facility] None    No orders of the defined types were placed in this encounter.   -------------------------------

## 2023-05-23 ENCOUNTER — Ambulatory Visit: Payer: Medicare HMO | Admitting: Psychology

## 2023-05-23 DIAGNOSIS — F331 Major depressive disorder, recurrent, moderate: Secondary | ICD-10-CM | POA: Diagnosis not present

## 2023-05-23 DIAGNOSIS — F1010 Alcohol abuse, uncomplicated: Secondary | ICD-10-CM | POA: Diagnosis not present

## 2023-05-23 DIAGNOSIS — F900 Attention-deficit hyperactivity disorder, predominantly inattentive type: Secondary | ICD-10-CM | POA: Diagnosis not present

## 2023-05-23 NOTE — Progress Notes (Signed)
[facility] Counselor/Therapist Progress Note  Patient ID: Tanya Barron, MRN: [id number]    Date: 05/23/23  Time Spent: 10:02 am - 10:53 am  51 Minutes  Treatment Type: Individual Therapy.  Reported Symptoms: Depression, anxiety, ADHD, and interpersonal stressors.   Mental Status Exam: Appearance:  Casual     Behavior: Appropriate  Motor: Normal  Speech/Language:  Clear and Coherent  Affect: Appropriate  Mood: dysthymic  Thought process: normal  Thought content:   WNL  Sensory/Perceptual disturbances:   WNL  Orientation: oriented to person, place, time/date, and situation  Attention: Good  Concentration: Good  Memory: WNL  Fund of knowledge:  Good  Insight:   Good  Judgment:  Good  Impulse Control: Good   Risk Assessment: Danger to Self:  No Self-injurious Behavior: No Danger to Others: No Duty to Warn:no Physical Aggression / Violence:No  Access to Firearms a concern: No  Gang Involvement:No   Subjective:   Tanya Barron participated in the session, in person in the office with the therapist, and consented to treatment. Tanya Barron reviewed the events of the past week. She noted being prescribed Strattera 18mg  every day and noted feeling agitated and irritable but could not recall if this was situational or due to the medication. She noted not being consistent with the medication. Therapist provided psycho-education regarding medication and encouraged Tanya Barron to contact her prescriber to give feedback regarding this issue. She noted her intent to contact her prescriber and provide feedback. She noted recently returning from a trip to [place] and enjoyed the experience, overall. She noted financial stressors due to an unexpected pet bills. She noted continued trepidation in regards to attending a few AA meetings and noted that her alcohol aiding in managing her panic symptoms. We explored this during the session. Therapist challenged the notion of certainty in  relation to her panic symptoms returning without alcohol and noted identifying ways to seek resources, should the need arise. She noted some trauma of her father passing and her daughter's significant accident being unprocessed. 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 Tanya Barron was engaged and motivated and expressed commitment towards goals.  A follow-up was scheduled for continued treatment.   Interventions: CBT and BA  Diagnosis:   Major depressive disorder, recurrent episode, moderate (HCC)  Alcohol abuse  Attention deficit hyperactivity disorder (ADHD), predominantly inattentive type  Psychiatric Treatment: No , but is pursing treatment via [facility]  Treatment Plan:  Client Abilities/Strengths Tanya Barron is intelligent, forthcoming, and motivated for change .  Support System: Daughter.   Client Treatment Preferences Outpatient therapy.   Client Statement of Needs Tanya Barron would like to more actively manage her finances, begin psychiatric treatment, improve fitness and health, reduce alcohol consumption,  spend more time outside, increase mindfulness, manage impulsivity, become more purposeful.   Treatment Level Weekly  Symptoms  Anxiety: worry, difficulty managing worry, worrying about different things, trouble relaxing, restlessness, irritability, feeling afraid something awful might happen.  (Status: maintained) Depression, loss of interest, feeling down, lethargy, poor appetite, trouble concentrating, psychomotor agitation, denied SI   (Status: maintained)  Goals:   Tanya Barron experiences symptoms of depression, anxiety, interpersonal stressors, and ADHD.    Target Date: 11/02/23 Frequency: Weekly  Progress: 0 Modality: individual    Therapist will provide referrals for additional resources as appropriate.  Therapist will provide  psycho-education regarding Tanya Barron's diagnosis and corresponding treatment approaches and interventions. Licensed Clinical  Social Worker, [name], LCSW will support the patient's ability to achieve the goals identified. will employ CBT, BA, Problem-solving, Solution Focused, Mindfulness,  coping skills, & other evidenced-based practices will be used to promote progress towards healthy functioning to help manage decrease symptoms associated with her diagnosis.   Reduce overall level, frequency, and intensity of the feelings of depression, anxiety and panic evidenced by decreased overall symptoms from 6 to 7 days/week to 0 to 1 days/week per client report for at least 3 consecutive months. Verbally express understanding of the relationship between feelings of depression, anxiety and their impact on thinking patterns and behaviors. Verbalize an understanding of the role that distorted thinking plays in creating fears, excessive worry, and ruminations.  Tanya Barron participated in the creation of the treatment plan)   [name], LCSW

## 2023-06-02 ENCOUNTER — Other Ambulatory Visit: Payer: Self-pay | Admitting: Adult Health

## 2023-06-02 DIAGNOSIS — F900 Attention-deficit hyperactivity disorder, predominantly inattentive type: Secondary | ICD-10-CM

## 2023-06-06 ENCOUNTER — Ambulatory Visit: Payer: Medicare HMO | Admitting: Psychology

## 2023-06-07 ENCOUNTER — Ambulatory Visit (INDEPENDENT_AMBULATORY_CARE_PROVIDER_SITE_OTHER): Payer: Medicare HMO | Admitting: Adult Health

## 2023-06-07 ENCOUNTER — Encounter: Payer: Self-pay | Admitting: Adult Health

## 2023-06-07 DIAGNOSIS — F1010 Alcohol abuse, uncomplicated: Secondary | ICD-10-CM

## 2023-06-07 DIAGNOSIS — F410 Panic disorder [episodic paroxysmal anxiety] without agoraphobia: Secondary | ICD-10-CM

## 2023-06-07 DIAGNOSIS — F411 Generalized anxiety disorder: Secondary | ICD-10-CM | POA: Diagnosis not present

## 2023-06-07 DIAGNOSIS — F331 Major depressive disorder, recurrent, moderate: Secondary | ICD-10-CM

## 2023-06-07 DIAGNOSIS — F900 Attention-deficit hyperactivity disorder, predominantly inattentive type: Secondary | ICD-10-CM

## 2023-06-07 MED ORDER — NALTREXONE HCL 50 MG PO TABS
50.0000 mg | ORAL_TABLET | Freq: Every day | ORAL | 2 refills | Status: DC
Start: 2023-06-07 — End: 2023-08-02

## 2023-06-07 NOTE — Progress Notes (Signed)
Tanya Barron [id number] [birth year] 72 y.o.  Subjective:   Patient ID:  Tanya Barron is a 72 y.o. (DOB May 03, 1951) female.  Chief Complaint: No chief complaint on file.   HPI Nilani Hugill presents to the office today for follow-up of MDD, GAD, Panic attacks, Alcohol use.   Referred by [name].  Describes mood today as "ok". Pleasant. Denies tearfulness. Mood symptoms - denies depression, anxiety, and irritability. Denies panic attacks. Denies some worry, rumination, and over thinking. Denies obsessive thoughts or acts.  Mood is consistent. Stating "I feel like I'm doing ok". Feels like medication is helpful. Continues to take Paxil 60mg  daily - has taken for 40 years. Is willing to try Naltrexone to help with alcohol use. Stable interest and motivation. Taking medications as prescribed.  Energy levels "ok". Active, does not have a regular exercise routine. Enjoys some usual interests and activities. Divorced. Lives alone with daughter - 71 special needs.  Her son and his family is local - 101 y/o. Spending time with family. Appetite adequate. Weight stable 134 pounds. Sleeps well most nights. Averages 10 to 12 hours. Focus and concentration difficulties. Reports trying the Stratera, but did not tolerate it. Completing tasks. Managing aspects of household. 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  Denies SI or HI.  Denies AH or VH. Denies self harm. Reports alcohol abuse - 6 beers a night 5 nights a week - trying to cut back.  Previous medication trials: Stratera  Review of Systems:  Review of Systems  Musculoskeletal:  Negative for gait problem.  Neurological:  Negative for tremors.  Psychiatric/Behavioral:         Please refer to HPI    Medications: I have reviewed the patient's current medications.  Current Outpatient Medications  Medication Sig Dispense Refill   alendronate (FOSAMAX) 70 MG tablet  Take 70 mg by mouth once a week.     amLODipine (NORVASC) 5 MG tablet      aspirin EC 81 MG tablet Take 1 tablet (81 mg total) by mouth daily. Swallow whole. 90 tablet 3   atomoxetine (STRATTERA) 18 MG capsule TAKE 1 CAPSULE BY MOUTH DAILY. 90 capsule 0   atorvastatin (LIPITOR) 40 MG tablet Take 1 tablet by mouth daily.     cetirizine (ZYRTEC) 10 MG tablet Take by mouth.     pantoprazole (PROTONIX) 40 MG tablet Take 1 tablet by mouth daily.     PARoxetine (PAXIL) 40 MG tablet Take 60 mg by mouth daily.     triamterene-hydrochlorothiazide (DYAZIDE) 37.5-25 MG capsule      No current facility-administered medications for this visit.    Medication Side Effects: None  Allergies:  Allergies  Allergen Reactions   Sulfamethoxazole-Trimethoprim Hives    Past Medical History:  Diagnosis Date   HTN (hypertension)    Seasonal allergies     Past Medical History, Surgical history, Social history, and Family history were reviewed and updated as appropriate.   Please see review of systems for further details on the patient's review from today.   Objective:   Physical Exam:  There were no vitals taken for this visit.  Physical Exam Constitutional:      General: She is not in acute distress. Musculoskeletal:        General: No deformity.  Neurological:     Mental Status: She is alert and oriented to person, place, and time.     Coordination:  Coordination normal.  Psychiatric:        Attention and Perception: Attention and perception normal. She does not perceive auditory or visual hallucinations.        Mood and Affect: Affect is not labile, blunt, angry or inappropriate.        Speech: Speech normal.        Behavior: Behavior normal.        Thought Content: Thought content normal. Thought content is not paranoid or delusional. Thought content does not include homicidal or suicidal ideation. Thought content does not include homicidal or suicidal plan.        Cognition and Memory:  Cognition and memory normal.        Judgment: Judgment normal.     Comments: Insight intact     Lab Review:  No results found for: "NA", "K", "CL", "CO2", "GLUCOSE", "BUN", "CREATININE", "CALCIUM", "PROT", "ALBUMIN", "AST", "ALT", "ALKPHOS", "BILITOT", "GFRNONAA", "GFRAA"  No results found for: "WBC", "RBC", "HGB", "HCT", "PLT", "MCV", "MCH", "MCHC", "RDW", "LYMPHSABS", "MONOABS", "EOSABS", "BASOSABS"  No results found for: "POCLITH", "LITHIUM"   No results found for: "PHENYTOIN", "PHENOBARB", "VALPROATE", "CBMZ"   .res Assessment: Plan:    Plan:  PDMP reviewed  Paxil 60mg  daily x 40 years  D/C Stratera 18mg  daily - did not tolerate it.  Discussed Naltrexone for alcohol use - will start at 25mg  daily.  Considering going to AA meetings  Discussed ADD - unable to offer treatment with current alcohol use.  RTC 4 weeks  Working with therapist   Patient advised to contact office with any questions, adverse effects, or acute worsening in signs and symptoms.  There are no diagnoses linked to this encounter.   Please see After Visit Summary for patient specific instructions.  Future Appointments  Date Time Provider Department Center  06/07/2023 10:00 AM Javarie Crisp, Thereasa Solo, NP [facility] None  06/20/2023 10:00 AM [name], LCSW [facility] None    No orders of the defined types were placed in this encounter.   -------------------------------

## 2023-06-20 ENCOUNTER — Ambulatory Visit: Payer: Medicare HMO | Admitting: Psychology

## 2023-06-20 DIAGNOSIS — F900 Attention-deficit hyperactivity disorder, predominantly inattentive type: Secondary | ICD-10-CM | POA: Diagnosis not present

## 2023-06-20 DIAGNOSIS — F331 Major depressive disorder, recurrent, moderate: Secondary | ICD-10-CM

## 2023-06-20 DIAGNOSIS — F411 Generalized anxiety disorder: Secondary | ICD-10-CM

## 2023-06-20 NOTE — Progress Notes (Signed)
[facility] Counselor/Therapist Progress Note  Patient ID: Tanya Barron, MRN: [id number]    Date: 06/20/23  Time Spent: 10:05 am - 10:44 am  39 Minutes  Treatment Type: Individual Therapy.  Reported Symptoms: Depression, anxiety, ADHD, and interpersonal stressors.   Mental Status Exam: Appearance:  Casual     Behavior: Appropriate  Motor: Normal  Speech/Language:  Clear and Coherent  Affect: Appropriate  Mood: anxious and dysthymic  Thought process: normal  Thought content:   WNL  Sensory/Perceptual disturbances:   WNL  Orientation: oriented to person, place, time/date, and situation  Attention: Good  Concentration: Good  Memory: WNL  Fund of knowledge:  Good  Insight:   Good  Judgment:  Good  Impulse Control: Good   Risk Assessment: Danger to Self:  No Self-injurious Behavior: No Danger to Others: No Duty to Warn:no Physical Aggression / Violence:No  Access to Firearms a concern: No  Gang Involvement:No   Subjective:   Tanya Barron participated in the session, in person in the office with the therapist, and consented to treatment. Tanya Barron reviewed the events of the past week. Tanya Barron noted being taken off of Strattera by her prescriber due to side-effects. She was prescribed Natrexone but noted feeling "spacey" but discussed her intent to start again. She noted the possibility of this being an issue of perspective regarding taking medication and not the medication itself. She noted not attending any AA meetings since our last session but could not identify any reason for this. She noted continued strain with her son and noted passive aggressive behavior by him. She noted her son's children witnessing this behavior and noted her worry that this will affect them going forward. We explored her most recent frustration regarding his behavior and her attempt to cope with this. 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 We worked on identifying barriers to attending AA meetings and discussed ways to address said barrier. She noted worry about her daughter's weight and health and discussed her daughter's refusal to meet with a weight-loss specialist due to the implication being overweight. We explored this during the session and ways to communicate concerns. Therapist validated and normalized Tanya Barron's feelings and experience and provided supportive therapy. A follow-up was scheduled for continued treatment.   Interventions: CBT and BA  Diagnosis:   Major depressive disorder, recurrent episode, moderate (HCC)  Generalized anxiety disorder  Attention deficit hyperactivity disorder (ADHD), predominantly inattentive type  Psychiatric Treatment: No , but is pursing treatment via [facility]  Treatment Plan:  Client Abilities/Strengths Tanya Barron is intelligent, forthcoming, and motivated for change .  Support System: Daughter.   Client Treatment Preferences Outpatient therapy.   Client Statement of Needs Tanya Barron would like to more actively manage her finances, begin psychiatric treatment, improve fitness and health, reduce alcohol consumption,  spend more time outside, increase mindfulness, manage impulsivity, become more purposeful.   Treatment Level Weekly  Symptoms  Anxiety: worry, difficulty managing worry, worrying about different things, trouble relaxing, restlessness, irritability, feeling afraid something awful might happen.  (Status: maintained) Depression, loss of interest, feeling down, lethargy, poor appetite, trouble concentrating, psychomotor agitation, denied SI   (Status: maintained)  Goals:   Tanya Barron experiences symptoms of depression, anxiety, interpersonal stressors, and ADHD.    Target Date: 11/02/23 Frequency: Weekly  Progress: 0 Modality: individual    Therapist will provide  referrals for additional resources  as appropriate.  Therapist will provide psycho-education regarding Tanya Barron's diagnosis and corresponding treatment approaches and interventions. Licensed Clinical Social Worker, [place], LCSW will support the patient's ability to achieve the goals identified. will employ CBT, BA, Problem-solving, Solution Focused, Mindfulness,  coping skills, & other evidenced-based practices will be used to promote progress towards healthy functioning to help manage decrease symptoms associated with her diagnosis.   Reduce overall level, frequency, and intensity of the feelings of depression, anxiety and panic evidenced by decreased overall symptoms from 6 to 7 days/week to 0 to 1 days/week per client report for at least 3 consecutive months. Verbally express understanding of the relationship between feelings of depression, anxiety and their impact on thinking patterns and behaviors. Verbalize an understanding of the role that distorted thinking plays in creating fears, excessive worry, and ruminations.  Tanya Barron participated in the creation of the treatment plan)   [name], LCSW

## 2023-07-04 ENCOUNTER — Ambulatory Visit: Payer: Medicare HMO | Admitting: Psychology

## 2023-07-05 ENCOUNTER — Encounter: Payer: Self-pay | Admitting: Adult Health

## 2023-07-05 ENCOUNTER — Ambulatory Visit (INDEPENDENT_AMBULATORY_CARE_PROVIDER_SITE_OTHER): Payer: Medicare HMO | Admitting: Adult Health

## 2023-07-05 DIAGNOSIS — F331 Major depressive disorder, recurrent, moderate: Secondary | ICD-10-CM

## 2023-07-05 DIAGNOSIS — F1010 Alcohol abuse, uncomplicated: Secondary | ICD-10-CM

## 2023-07-05 DIAGNOSIS — F900 Attention-deficit hyperactivity disorder, predominantly inattentive type: Secondary | ICD-10-CM | POA: Diagnosis not present

## 2023-07-05 DIAGNOSIS — F411 Generalized anxiety disorder: Secondary | ICD-10-CM

## 2023-07-05 DIAGNOSIS — F410 Panic disorder [episodic paroxysmal anxiety] without agoraphobia: Secondary | ICD-10-CM

## 2023-07-05 NOTE — Progress Notes (Signed)
Tanya Barron [id number] 1951/08/01 72 y.o.  Subjective:   Patient ID:  Tanya Barron is a 72 y.o. (DOB August 27, 1951) female.  Chief Complaint: No chief complaint on file.   HPI Tanya Barron presents to the office today for follow-up of MDD, GAD, Panic attacks, Alcohol use.   Referred by [name].  Describes mood today as "ok". Pleasant. Denies tearfulness. Mood symptoms - reports some depression, anxiety, and irritability - "some days are better than others". Denies panic attacks. Denies some worry, rumination, and over thinking. Denies obsessive thoughts or acts. Mood is consistent. Stating "I feel like I'm doing better". Feels like medication is helpful. Has tolerated the addition of Naltexone - reduced alcohol use. Continues to take Paxil 60mg  daily - 40 years. Stable interest and motivation. Taking medications as prescribed.  Energy levels "ok". Active, does not have a regular exercise routine. Enjoys some usual interests and activities. Divorced. Lives alone with daughter - 22 special needs.  Her son and his family is local - 54 y/o. Spending time with family. Appetite adequate. Weight stable 134 pounds. Sleeps well most nights. Averages 10 to 12 hours. Focus and concentration improved - getting things done. Completing tasks. Managing aspects of household. 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  Denies SI or HI.  Denies AH or VH. Denies self harm. Reports decreased alcohol abuse.  Previous medication trials: Stratera  Review of Systems:  Review of Systems  Musculoskeletal:  Negative for gait problem.  Neurological:  Negative for tremors.  Psychiatric/Behavioral:         Please refer to HPI    Medications: I have reviewed the patient's current medications.  Current Outpatient Medications  Medication Sig Dispense Refill   alendronate (FOSAMAX) 70 MG tablet Take 70 mg by mouth once a week.      amLODipine (NORVASC) 5 MG tablet      aspirin EC 81 MG tablet Take 1 tablet (81 mg total) by mouth daily. Swallow whole. 90 tablet 3   atorvastatin (LIPITOR) 40 MG tablet Take 1 tablet by mouth daily.     cetirizine (ZYRTEC) 10 MG tablet Take by mouth.     naltrexone (DEPADE) 50 MG tablet Take 1 tablet (50 mg total) by mouth daily. 15 tablet 2   pantoprazole (PROTONIX) 40 MG tablet Take 1 tablet by mouth daily.     PARoxetine (PAXIL) 40 MG tablet Take 60 mg by mouth daily.     triamterene-hydrochlorothiazide (DYAZIDE) 37.5-25 MG capsule      No current facility-administered medications for this visit.    Medication Side Effects: None  Allergies:  Allergies  Allergen Reactions   Sulfamethoxazole-Trimethoprim Hives    Past Medical History:  Diagnosis Date   HTN (hypertension)    Seasonal allergies     Past Medical History, Surgical history, Social history, and Family history were reviewed and updated as appropriate.   Please see review of systems for further details on the patient's review from today.   Objective:   Physical Exam:  There were no vitals taken for this visit.  Physical Exam Constitutional:      General: She is not in acute distress. Musculoskeletal:        General: No deformity.  Neurological:     Mental Status: She is alert and oriented to person, place, and time.     Coordination: Coordination normal.  Psychiatric:  Attention and Perception: Attention and perception normal. She does not perceive auditory or visual hallucinations.        Mood and Affect: Mood normal. Mood is not anxious or depressed. Affect is not labile, blunt, angry or inappropriate.        Speech: Speech normal.        Behavior: Behavior normal.        Thought Content: Thought content normal. Thought content is not paranoid or delusional. Thought content does not include homicidal or suicidal ideation. Thought content does not include homicidal or suicidal plan.        Cognition  and Memory: Cognition and memory normal.        Judgment: Judgment normal.     Comments: Insight intact     Lab Review:  No results found for: "NA", "K", "CL", "CO2", "GLUCOSE", "BUN", "CREATININE", "CALCIUM", "PROT", "ALBUMIN", "AST", "ALT", "ALKPHOS", "BILITOT", "GFRNONAA", "GFRAA"  No results found for: "WBC", "RBC", "HGB", "HCT", "PLT", "MCV", "MCH", "MCHC", "RDW", "LYMPHSABS", "MONOABS", "EOSABS", "BASOSABS"  No results found for: "POCLITH", "LITHIUM"   No results found for: "PHENYTOIN", "PHENOBARB", "VALPROATE", "CBMZ"   .res Assessment: Plan:    Plan:  PDMP reviewed  Paxil 60mg  daily x 40 years  Discussed Naltrexone for alcohol use - 25mg  daily.  Considering going to AA meetings  Discussed ADD - unable to offer treatment with current alcohol use.  RTC 4 weeks  Working with therapist   Patient advised to contact office with any questions, adverse effects, or acute worsening in signs and symptoms.  There are no diagnoses linked to this encounter.   Please see After Visit Summary for patient specific instructions.  Future Appointments  Date Time Provider Department Center  07/05/2023 10:20 AM Marciano Mundt, Thereasa Solo, NP [facility] None    No orders of the defined types were placed in this encounter.   -------------------------------

## 2023-07-08 DIAGNOSIS — R109 Unspecified abdominal pain: Secondary | ICD-10-CM | POA: Diagnosis not present

## 2023-07-08 DIAGNOSIS — R195 Other fecal abnormalities: Secondary | ICD-10-CM | POA: Diagnosis not present

## 2023-07-08 DIAGNOSIS — K5900 Constipation, unspecified: Secondary | ICD-10-CM | POA: Diagnosis not present

## 2023-07-08 DIAGNOSIS — M545 Low back pain, unspecified: Secondary | ICD-10-CM | POA: Diagnosis not present

## 2023-08-01 ENCOUNTER — Ambulatory Visit (INDEPENDENT_AMBULATORY_CARE_PROVIDER_SITE_OTHER): Payer: Medicare HMO | Admitting: Psychology

## 2023-08-01 DIAGNOSIS — F900 Attention-deficit hyperactivity disorder, predominantly inattentive type: Secondary | ICD-10-CM

## 2023-08-01 DIAGNOSIS — F411 Generalized anxiety disorder: Secondary | ICD-10-CM

## 2023-08-01 DIAGNOSIS — F331 Major depressive disorder, recurrent, moderate: Secondary | ICD-10-CM

## 2023-08-01 NOTE — Progress Notes (Signed)
[facility] Counselor/Therapist Progress Note  Patient ID: Tzippy Ketner, MRN: [id number]    Date: 08/01/23  Time Spent: 12:34 pm - 1:26 pm  52 Minutes  Treatment Type: Individual Therapy.  Reported Symptoms: Depression, anxiety, ADHD, and interpersonal stressors.   Mental Status Exam: Appearance:  Casual     Behavior: Appropriate  Motor: Normal  Speech/Language:  Clear and Coherent  Affect: Appropriate  Mood: anxious and dysthymic  Thought process: normal  Thought content:   WNL  Sensory/Perceptual disturbances:   WNL  Orientation: oriented to person, place, time/date, and situation  Attention: Good  Concentration: Good  Memory: WNL  Fund of knowledge:  Good  Insight:   Good  Judgment:  Good  Impulse Control: Good   Risk Assessment: Danger to Self:  No Self-injurious Behavior: No Danger to Others: No Duty to Warn:no Physical Aggression / Violence:No  Access to Firearms a concern: No  Gang Involvement:No   Subjective:   [name] participated in the session, in person in the office with the therapist, and consented to treatment. Pashance reviewed the events of the past week. Aaya noted discontinuing her Naltrexone due to GI issues. She is slated to see her provider tomorrow to discuss concerns. 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 She noted some mild improvement in her relationship with her son. She has not attended an [employer] She noted "grappling" about her drinking and that it's difficult to discontinue after 1 drink. She noted barriers including  "I am not wanting to announce to people that I am an alcoholic", having to ask for help, the required commitment, feeling disappointment in self that she can't make these changes alone, "counting on it" (beer), seeing it as a reward and reinforcement, "part of the routine", sense of self-responsibility, external pressure  (children), "benefits" on mood, and the routine of happy hour. She noted her core belief being "I am responsible". We worked on exploring this during the session and her thinking and perspective regarding said barriers. Munisa was tearful during the session and we worked on exploring this during the session. Therapist provided psycho-education regarding addiction. We discussed her expectations and therapist encouraged Jaasritha to identify expectations to be processed going forward.  Sedra was engaged during the session. Therapist validated Jeanae's feelings and encouraged continued work in this area. Therapist highlighted the possible benefits of her goal of attended an AA meeting and encouraged continued work in this area. Therapist provided supportive therapy. A follow-up was scheduled for continued treatment which Kailia benefits from.    Interventions: CBT and BA  Diagnosis:   Major depressive disorder, recurrent episode, moderate (HCC)  Generalized anxiety disorder  Attention deficit hyperactivity disorder (ADHD), predominantly inattentive type  Psychiatric Treatment: No , but is pursing treatment via [facility]  Treatment Plan:  Client Abilities/Strengths Zuley is intelligent, forthcoming, and motivated for change .  Support System: Daughter.   Client Treatment Preferences Outpatient therapy.   Client Statement of Needs Thessaly would like to more actively manage her finances, begin psychiatric treatment, improve fitness and health, reduce alcohol consumption,  spend more time outside, increase mindfulness, manage impulsivity, become more purposeful.   Treatment Level Weekly  Symptoms  Anxiety: worry, difficulty managing worry, worrying about different things, trouble relaxing, restlessness, irritability, feeling afraid something awful might happen.  (Status: maintained) Depression, loss of interest, feeling down, lethargy, poor appetite, trouble concentrating,  psychomotor agitation, denied SI   (Status:  maintained)  Goals:   Rhiannan experiences symptoms of depression, anxiety, interpersonal stressors, and ADHD.    Target Date: 11/02/23 Frequency: Weekly  Progress: 0 Modality: individual    Therapist will provide referrals for additional resources as appropriate.  Therapist will provide psycho-education regarding Allisa's diagnosis and corresponding treatment approaches and interventions. Licensed Clinical Social Worker, [place], LCSW will support the patient's ability to achieve the goals identified. will employ CBT, BA, Problem-solving, Solution Focused, Mindfulness,  coping skills, & other evidenced-based practices will be used to promote progress towards healthy functioning to help manage decrease symptoms associated with her diagnosis.   Reduce overall level, frequency, and intensity of the feelings of depression, anxiety and panic evidenced by decreased overall symptoms from 6 to 7 days/week to 0 to 1 days/week per client report for at least 3 consecutive months. Verbally express understanding of the relationship between feelings of depression, anxiety and their impact on thinking patterns and behaviors. Verbalize an understanding of the role that distorted thinking plays in creating fears, excessive worry, and ruminations.  [name] participated in the creation of the treatment plan)   [name], LCSW

## 2023-08-02 ENCOUNTER — Ambulatory Visit (INDEPENDENT_AMBULATORY_CARE_PROVIDER_SITE_OTHER): Payer: Medicare HMO | Admitting: Adult Health

## 2023-08-02 ENCOUNTER — Encounter: Payer: Self-pay | Admitting: Adult Health

## 2023-08-02 DIAGNOSIS — F331 Major depressive disorder, recurrent, moderate: Secondary | ICD-10-CM | POA: Diagnosis not present

## 2023-08-02 DIAGNOSIS — F1010 Alcohol abuse, uncomplicated: Secondary | ICD-10-CM

## 2023-08-02 DIAGNOSIS — F411 Generalized anxiety disorder: Secondary | ICD-10-CM | POA: Diagnosis not present

## 2023-08-02 DIAGNOSIS — F410 Panic disorder [episodic paroxysmal anxiety] without agoraphobia: Secondary | ICD-10-CM | POA: Diagnosis not present

## 2023-08-02 NOTE — Progress Notes (Signed)
Tanya Barron [id number] 27-Apr-1951 72 y.o.  Subjective:   Patient ID:  Tanya Barron is a 71 y.o. (DOB [birth year]) female.  Chief Complaint: No chief complaint on file.   HPI Tanya Barron presents to the office today for follow-up of MDD, GAD, Panic attacks, Alcohol use.  Referred by [name].  Describes mood today as "ok". Pleasant. Denies tearfulness. Mood symptoms - reports depression, anxiety, and irritability - "sometimes". Denies panic attacks. Denies worry, rumination, and over thinking. Denies obsessive thoughts or acts. Mood is consistent. Stating "I feel like I'm doing ok". Feels like medication is helpful. Has discontinued the Naltrexone for alcohol cessation due to constipation. Continues to take Paxil 60mg  daily - 40 years. Stable interest and motivation. Taking medications as prescribed.  Energy levels "about the same". Active, does not have a regular exercise routine. Enjoys some usual interests and activities. Divorced. Lives alone with daughter - 23 special needs.  Her son and his family is local - 9 y/o. Spending time with family. Appetite adequate. Weight stable 134 pounds. Sleeps well most nights. Averages 10 hours. Focus and concentration difficulties - "scattered brain". Completing tasks. Managing aspects of household. 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  Denies SI or HI.  Denies AH or VH. Denies self harm. Reports decreased alcohol abuse.  Previous medication trials: Stratera  Review of Systems:  Review of Systems  Musculoskeletal:  Negative for gait problem.  Neurological:  Negative for tremors.  Psychiatric/Behavioral:         Please refer to HPI    Medications: I have reviewed the patient's current medications.  Current Outpatient Medications  Medication Sig Dispense Refill   alendronate (FOSAMAX) 70 MG tablet Take 70 mg by mouth once a week.     amLODipine (NORVASC) 5  MG tablet      aspirin EC 81 MG tablet Take 1 tablet (81 mg total) by mouth daily. Swallow whole. 90 tablet 3   atorvastatin (LIPITOR) 40 MG tablet Take 1 tablet by mouth daily.     cetirizine (ZYRTEC) 10 MG tablet Take by mouth.     naltrexone (DEPADE) 50 MG tablet Take 1 tablet (50 mg total) by mouth daily. 15 tablet 2   pantoprazole (PROTONIX) 40 MG tablet Take 1 tablet by mouth daily.     PARoxetine (PAXIL) 40 MG tablet Take 60 mg by mouth daily.     triamterene-hydrochlorothiazide (DYAZIDE) 37.5-25 MG capsule      No current facility-administered medications for this visit.    Medication Side Effects: None  Allergies:  Allergies  Allergen Reactions   Sulfamethoxazole-Trimethoprim Hives    Past Medical History:  Diagnosis Date   HTN (hypertension)    Seasonal allergies     Past Medical History, Surgical history, Social history, and Family history were reviewed and updated as appropriate.   Please see review of systems for further details on the patient's review from today.   Objective:   Physical Exam:  There were no vitals taken for this visit.  Physical Exam Constitutional:      General: She is not in acute distress. Musculoskeletal:        General: No deformity.  Neurological:     Mental Status: She is alert and oriented to person, place, and time.     Coordination: Coordination normal.  Psychiatric:        Attention and Perception: Attention and perception normal. She does  not perceive auditory or visual hallucinations.        Mood and Affect: Affect is not labile, blunt, angry or inappropriate.        Speech: Speech normal.        Behavior: Behavior normal.        Thought Content: Thought content normal. Thought content is not paranoid or delusional. Thought content does not include homicidal or suicidal ideation. Thought content does not include homicidal or suicidal plan.        Cognition and Memory: Cognition and memory normal.        Judgment: Judgment  normal.     Comments: Insight intact     Lab Review:  No results found for: "NA", "K", "CL", "CO2", "GLUCOSE", "BUN", "CREATININE", "CALCIUM", "PROT", "ALBUMIN", "AST", "ALT", "ALKPHOS", "BILITOT", "GFRNONAA", "GFRAA"  No results found for: "WBC", "RBC", "HGB", "HCT", "PLT", "MCV", "MCH", "MCHC", "RDW", "LYMPHSABS", "MONOABS", "EOSABS", "BASOSABS"  No results found for: "POCLITH", "LITHIUM"   No results found for: "PHENYTOIN", "PHENOBARB", "VALPROATE", "CBMZ"   .res Assessment: Plan:    Plan:  PDMP reviewed  Paxil 60mg  daily x 40 years  D/C Naltrexone for alcohol use - 25mg  daily - constipation.  Considering going to AA meetings  Discussed ADD - unable to offer treatment with current alcohol use.  RTC 4 weeks  Working with therapist   Patient advised to contact office with any questions, adverse effects, or acute worsening in signs and symptoms.  There are no diagnoses linked to this encounter.   Please see After Visit Summary for patient specific instructions.  Future Appointments  Date Time Provider Department Center  08/02/2023 11:40 AM Danesha Kirchoff, Thereasa Solo, NP [facility] None    No orders of the defined types were placed in this encounter.   -------------------------------

## 2023-08-11 ENCOUNTER — Other Ambulatory Visit: Payer: Self-pay | Admitting: Family Medicine

## 2023-08-11 DIAGNOSIS — Z1231 Encounter for screening mammogram for malignant neoplasm of breast: Secondary | ICD-10-CM

## 2023-08-23 ENCOUNTER — Ambulatory Visit (INDEPENDENT_AMBULATORY_CARE_PROVIDER_SITE_OTHER): Payer: Medicare HMO | Admitting: Psychology

## 2023-08-23 DIAGNOSIS — F331 Major depressive disorder, recurrent, moderate: Secondary | ICD-10-CM

## 2023-08-23 DIAGNOSIS — F411 Generalized anxiety disorder: Secondary | ICD-10-CM

## 2023-08-23 NOTE — Progress Notes (Signed)
[facility] Counselor/Therapist Progress Note  Patient ID: Tanya Barron, MRN: [id number]    Date: 08/23/23  Time Spent: 1:08 pm - 1:48 pm  40 Minutes  Treatment Type: Individual Therapy.  Reported Symptoms: Depression, anxiety, ADHD, and interpersonal stressors.   Mental Status Exam: Appearance:  Casual     Behavior: Appropriate  Motor: Normal  Speech/Language:  Clear and Coherent  Affect: Appropriate  Mood: anxious and dysthymic  Thought process: normal  Thought content:   WNL  Sensory/Perceptual disturbances:   WNL  Orientation: oriented to person, place, time/date, and situation  Attention: Good  Concentration: Good  Memory: WNL  Fund of knowledge:  Good  Insight:   Good  Judgment:  Good  Impulse Control: Good   Risk Assessment: Danger to Self:  No Self-injurious Behavior: No Danger to Others: No Duty to Warn:no Physical Aggression / Violence:No  Access to Firearms a concern: No  Gang Involvement:No   Subjective:   [name] participated in the session, in person in the office with the therapist, and consented to treatment. Kambry reviewed the events of the past week. [name] noted meeting with her psychiatric provider and that she will contact her again to discuss the possibility of stimulant medication for her ADHD. Lari continues to take the Paxil. Edynn has attended church, between sessions, but did not attend an AA meeting yet. She noted driving by but noted not stopping for the meeting. We worked on processing her self-talk including "I would be 10 minutes late", couldn't decipher what "closed meeting" meant, and feeling like "doing two new things in one day" was taxing. We worked on exploring this during the session and worked on challenging this. Tiana noted enjoying her recent church visit and is considering volunteering as a way to meet adults and make new friends. Therapist praised Litisha for her effort in this area. She noted mild  improvement in dynamics with her son but noted this being surface level. She noted that "money is becoming an issue again" and noted this being stressful. She noted this being a motivator to consider part-time employment. We set a goal to enter the meeting building and take a look and build a level of comfort. Therapist encouraged Finleigh to identify what worries or concerns she has about attending an AA meeting and working on challenging this and building a level of comfort. Keirstin was engaged and motivated. She expressed commitment towards goals. Therapist praised Christina and provided supportive therapy. A follow-up was scheduled for continued treatment.   Interventions: CBT and BA  Diagnosis:   Major depressive disorder, recurrent episode, moderate (HCC)  Generalized anxiety disorder  Psychiatric Treatment: No , but is pursing treatment via [facility]  Treatment Plan:  Client Abilities/Strengths Averill is intelligent, forthcoming, and motivated for change .  Support System: Daughter.   Client Treatment Preferences Outpatient therapy.   Client Statement of Needs Mairany would like to more actively manage her finances, begin psychiatric treatment, improve fitness and health, reduce alcohol consumption,  spend more time outside, increase mindfulness, manage impulsivity, become more purposeful.   Treatment Level Weekly  Symptoms  Anxiety: worry, difficulty managing worry, worrying about different things, trouble relaxing, restlessness, irritability, feeling afraid something awful might happen.  (Status: maintained) Depression, loss of interest, feeling down, lethargy, poor appetite, trouble concentrating, psychomotor agitation, denied SI   (Status: maintained)  Goals:   Jalaila experiences symptoms of depression, anxiety, interpersonal stressors, and ADHD.    Target Date: 11/02/23 Frequency: Weekly  Progress: 0  Modality: individual    Therapist will provide referrals  for additional resources as appropriate.  Therapist will provide psycho-education regarding Macenzie's diagnosis and corresponding treatment approaches and interventions. Licensed Clinical Social Worker, [place], LCSW will support the patient's ability to achieve the goals identified. will employ CBT, BA, Problem-solving, Solution Focused, Mindfulness,  coping skills, & other evidenced-based practices will be used to promote progress towards healthy functioning to help manage decrease symptoms associated with her diagnosis.   Reduce overall level, frequency, and intensity of the feelings of depression, anxiety and panic evidenced by decreased overall symptoms from 6 to 7 days/week to 0 to 1 days/week per client report for at least 3 consecutive months. Verbally express understanding of the relationship between feelings of depression, anxiety and their impact on thinking patterns and behaviors. Verbalize an understanding of the role that distorted thinking plays in creating fears, excessive worry, and ruminations.  [name] participated in the creation of the treatment plan)   [name], LCSW

## 2023-09-07 ENCOUNTER — Ambulatory Visit
Admission: RE | Admit: 2023-09-07 | Discharge: 2023-09-07 | Disposition: A | Discharge status: Home / Self Care | Payer: Medicare HMO | Source: Ambulatory Visit | Attending: Family Medicine | Admitting: Family Medicine

## 2023-09-07 DIAGNOSIS — Z1231 Encounter for screening mammogram for malignant neoplasm of breast: Secondary | ICD-10-CM

## 2023-09-26 ENCOUNTER — Ambulatory Visit: Payer: Medicare HMO | Admitting: Psychology

## 2023-09-26 DIAGNOSIS — F410 Panic disorder [episodic paroxysmal anxiety] without agoraphobia: Secondary | ICD-10-CM | POA: Diagnosis not present

## 2023-09-26 DIAGNOSIS — F411 Generalized anxiety disorder: Secondary | ICD-10-CM

## 2023-09-26 DIAGNOSIS — F331 Major depressive disorder, recurrent, moderate: Secondary | ICD-10-CM | POA: Diagnosis not present

## 2023-09-26 NOTE — Progress Notes (Signed)
[facility] Counselor/Therapist Progress Note  Patient ID: Tanya Barron, MRN: [id number]    Date: 09/26/23  Time Spent: 11:01 am -11:43 am  42 Minutes  Treatment Type: Individual Therapy.  Reported Symptoms: Depression, anxiety, ADHD, and interpersonal stressors.   Mental Status Exam: Appearance:  Casual     Behavior: Appropriate  Motor: Normal  Speech/Language:  Clear and Coherent  Affect: Appropriate  Mood: anxious and dysthymic  Thought process: normal  Thought content:   WNL  Sensory/Perceptual disturbances:   WNL  Orientation: oriented to person, place, time/date, and situation  Attention: Good  Concentration: Good  Memory: WNL  Fund of knowledge:  Good  Insight:   Good  Judgment:  Good  Impulse Control: Good   Risk Assessment: Danger to Self:  No Self-injurious Behavior: No Danger to Others: No Duty to Warn:no Physical Aggression / Violence:No  Access to Firearms a concern: No  Gang Involvement:No   Subjective:   Tanya Barron participated in the session, in person in the office with the therapist, and consented to treatment. Tanya Barron reviewed the events of the past week. Tanya Barron noted visiting her brother in [place] and noted the trip being enjoyable, overall. She noted there being "lots of beer there". She noted "I am back on being serious on trying to get healthier", is hopeful to get a part-time job, and "getting to some meetings". She noted interest in moving but discussed no "hint or decision" as of yet. She noted the current being "affordable" but has been "touch and go". She noted that the "natural light" being an issue.  She noted often feeling blamed for her daughter's lack of life goals and general inaction, despite her daughter being an adult. She noted a need to participate in activities and not let her daughter declining be a deciding factor for whether Tanya Barron goes or not. She noted interest in engaging in various tasks and activities but  has not started. We discussed ways to get organized and how to build a possible routine of engagement. She created a list, during the session, as a way to keep track of her interests. Therapist praised Tanya Barron for her effort in this area. Therapist encouraged Tanya Barron to work on checking her notebook and planning a small task everyday that corresponds with these areas of interest as follow-up. Therapist praised Johnee for her effort in this area and provided supportive therapy.  A follow-up was scheduled for continued treatment, which Tanya Barron benefits from.   Interventions: CBT and BA  Diagnosis:   Major depressive disorder, recurrent episode, moderate (HCC)  Generalized anxiety disorder  Panic attacks  Psychiatric Treatment: Yes , Tanya Barron.  Treatment Plan:  Client Abilities/Strengths Tanya Barron is intelligent, forthcoming, and motivated for change .  Support System: Daughter.   Client Treatment Preferences Outpatient therapy.   Client Statement of Needs Tanya Barron would like to more actively manage her finances, begin psychiatric treatment, improve fitness and health, reduce alcohol consumption,  spend more time outside, increase mindfulness, manage impulsivity, become more purposeful.   Treatment Level Weekly  Symptoms  Anxiety: worry, difficulty managing worry, worrying about different things, trouble relaxing, restlessness, irritability, feeling afraid something awful might happen.  (Status: maintained) Depression, loss of interest, feeling down, lethargy, poor appetite, trouble concentrating, psychomotor agitation, denied SI   (Status: maintained)  Goals:   Tanya Barron experiences symptoms of depression, anxiety, interpersonal stressors, and ADHD.    Target Date: 11/02/23 Frequency: Weekly  Progress: 0 Modality: individual    Therapist will provide referrals for  additional resources as appropriate.  Therapist will provide psycho-education regarding Tanya Barron's diagnosis and  corresponding treatment approaches and interventions. Licensed Clinical Social Worker, [place], LCSW will support the patient's ability to achieve the goals identified. will employ CBT, BA, Problem-solving, Solution Focused, Mindfulness,  coping skills, & other evidenced-based practices will be used to promote progress towards healthy functioning to help manage decrease symptoms associated with her diagnosis.   Reduce overall level, frequency, and intensity of the feelings of depression, anxiety and panic evidenced by decreased overall symptoms from 6 to 7 days/week to 0 to 1 days/week per client report for at least 3 consecutive months. Verbally express understanding of the relationship between feelings of depression, anxiety and their impact on thinking patterns and behaviors. Verbalize an understanding of the role that distorted thinking plays in creating fears, excessive worry, and ruminations.  [name] participated in the creation of the treatment plan)   [name], LCSW

## 2023-10-31 ENCOUNTER — Ambulatory Visit: Payer: Medicare HMO | Admitting: Psychology

## 2023-10-31 DIAGNOSIS — F411 Generalized anxiety disorder: Secondary | ICD-10-CM

## 2023-10-31 DIAGNOSIS — F331 Major depressive disorder, recurrent, moderate: Secondary | ICD-10-CM | POA: Diagnosis not present

## 2023-10-31 NOTE — Progress Notes (Addendum)
[facility] Counselor/Therapist Progress Note  Patient ID: Tanya Barron, MRN: [id number]    Date: 10/31/23  Time Spent: 12:33 pm -1:22 pm  49 Minutes  Treatment Type: Individual Therapy.  Reported Symptoms: Depression, anxiety, ADHD, and interpersonal stressors.   Mental Status Exam: Appearance:  Casual     Behavior: Appropriate  Motor: Normal  Speech/Language:  Clear and Coherent  Affect: Appropriate  Mood: anxious and dysthymic  Thought process: normal  Thought content:   WNL  Sensory/Perceptual disturbances:   WNL  Orientation: oriented to person, place, time/date, and situation  Attention: Good  Concentration: Good  Memory: WNL  Fund of knowledge:  Good  Insight:   Good  Judgment:  Good  Impulse Control: Good   Risk Assessment: Danger to Self:  No Self-injurious Behavior: No Danger to Others: No Duty to Warn:no Physical Aggression / Violence:No  Access to Firearms a concern: No  Gang Involvement:No   Subjective:   Tanya Barron participated in the session, in person in the office with the therapist, and consented to treatment. Tanya Barron reviewed the events of the past week.  She noted not getting many of her goals completed, between sessions. She noted working on looking into a part-time job but that "I wasn't into it, so I didn't complete it". We explored this during the session. She did complete a new resume to prepare for the process. We worked on processing how much motivation or interest needs to be present for engagement. She noted difficulty organizing her belongings and noted this being a barrier to engagement in enjoyable activities. She noted growing up in a very organized home and "not having to do it", herself, due to her mother's organization. She noted that her daughter does not help and noted the task being "insurmountable". We worked on exploring her expectation and the effects of said expectations on her mood. Tanya Barron noted feeling anxious  when the task isn't completed and this often affecting her sleep. Therapist encouraged Tanya Barron to identify reasonable and sustainable expectations and goals, respectively. We discussed  creating a schedule that meets said expectations and to modulate output as she noted often hyperfocusing followed by feelings of disappointment.  She noted her son was antagonistic over the phone and she verbally pushed back and was hung up on. We processed this during the session. Therapist praised Tanya Barron for her effort to set boundaries with her son. We discussed various ways to communicate assertively, going forward. Tanya Barron was engaged and motivated during the session. She expressed commitment towards goals. Therapist praised Tanya Barron for her effort and energy and provided supportive therapy. A follow-up was scheduled for continued treatment.    Interventions: CBT and BA  Diagnosis:   Major depressive disorder, recurrent episode, moderate (HCC)  Generalized anxiety disorder  Psychiatric Treatment: Yes , Reg[facility].  Treatment Plan:  Client Abilities/Strengths Tanya Barron is intelligent, forthcoming, and motivated for change .  Support System: Daughter.   Client Treatment Preferences Outpatient therapy.   Client Statement of Needs Tanya Barron would like to more actively manage her finances, begin psychiatric treatment, improve fitness and health, reduce alcohol consumption,  spend more time outside, increase mindfulness, manage impulsivity, become more purposeful.   Treatment Level Weekly  Symptoms  Anxiety: worry, difficulty managing worry, worrying about different things, trouble relaxing, restlessness, irritability, feeling afraid something awful might happen.  (Status: maintained) Depression, loss of interest, feeling down, lethargy, poor appetite, trouble concentrating, psychomotor agitation, denied SI   (Status: maintained)  Goals:   Tanya Barron experiences symptoms  of depression, anxiety, interpersonal  stressors, and ADHD.    Target Date: 12/13/23 Frequency: Weekly  Progress: 0 Modality: individual    Therapist will provide referrals for additional resources as appropriate.  Therapist will provide psycho-education regarding Tanya Barron's diagnosis and corresponding treatment approaches and interventions. Licensed Clinical Social Worker, [place], LCSW will support the patient's ability to achieve the goals identified. will employ CBT, BA, Problem-solving, Solution Focused, Mindfulness,  coping skills, & other evidenced-based practices will be used to promote progress towards healthy functioning to help manage decrease symptoms associated with her diagnosis.   Reduce overall level, frequency, and intensity of the feelings of depression, anxiety and panic evidenced by decreased overall symptoms from 6 to 7 days/week to 0 to 1 days/week per client report for at least 3 consecutive months. Verbally express understanding of the relationship between feelings of depression, anxiety and their impact on thinking patterns and behaviors. Verbalize an understanding of the role that distorted thinking plays in creating fears, excessive worry, and ruminations.  Tanya Barron participated in the creation of the treatment plan)   Tanya Ovens, LCSW

## 2023-12-12 ENCOUNTER — Ambulatory Visit (INDEPENDENT_AMBULATORY_CARE_PROVIDER_SITE_OTHER): Payer: Medicare HMO | Admitting: Psychology

## 2023-12-12 DIAGNOSIS — F331 Major depressive disorder, recurrent, moderate: Secondary | ICD-10-CM | POA: Diagnosis not present

## 2023-12-12 DIAGNOSIS — F411 Generalized anxiety disorder: Secondary | ICD-10-CM | POA: Diagnosis not present

## 2023-12-12 NOTE — Progress Notes (Signed)
[facility] Counselor/Therapist Progress Note  Patient ID: Asalee Barrette, MRN: [id number]    Date: 12/12/23  Time Spent: 12:32 pm - 1:30 pm  58 Minutes  Treatment Type: Individual Therapy.  Reported Symptoms: Depression, anxiety, ADHD, and interpersonal stressors.   Mental Status Exam: Appearance:  Casual     Behavior: Appropriate  Motor: Normal  Speech/Language:  Clear and Coherent  Affect: Appropriate  Mood: anxious and dysthymic  Thought process: normal  Thought content:   WNL  Sensory/Perceptual disturbances:   WNL  Orientation: oriented to person, place, time/date, and situation  Attention: Good  Concentration: Good  Memory: WNL  Fund of knowledge:  Good  Insight:   Good  Judgment:  Good  Impulse Control: Good   Risk Assessment: Danger to Self:  No Self-injurious Behavior: No Danger to Others: No Duty to Warn:no Physical Aggression / Violence:No  Access to Firearms a concern: No  Gang Involvement:No   Subjective:   [name] participated in the session, in person in the office with the therapist, and consented to treatment. Treniece reviewed the events of the past week. She noted continued difficulty initiating tasks and noted that there is a conference coming up about aging and wellness that she is interest in attending and hopeful that this will be a motivator. She noted her interest in moving to a new home but noted that this could be financially stressful. We explored this during the session and Chanler noted her daughter's general lack of assistance during moves, increasing costs of new home ownership, and current financial pressures as barrier to this change. She noted her daughter being in a "funk" and that "I can be talked out of it too". She noted her daughter often making comments "that she thinks is funny". Nereida noted a need to set boundaries and communicate feelings about daughter's comments. Therapist encouraged Damiah for her  recognition in this area. She noted "surviving" the inauguration. She noted not seeing her psychiatric provider in some time and noted that she would need to discontinue drinking prior to pursuing other treatment. She noted considering consuming non-alcoholic beer but has yet to pursue it. She noted that "this is definitely a possibility". She noted her continued difficulty initiating tasks. We continued to explore this. Barriers include lack of convenience, motivation, influence from daughter, negative self-talk, perception of inconvenience of tasks, slow payoff, "what's the point", having to "delay gratification", & planning alternative tasks at home. She noted feeling bad for not engaging in exercise, for example, when she has a routine but noted that she does not have a routine. We worked on identifying ways to build a simple routine, address barriers, engage in activities earlier in the day, and use reinforcers. Therapist modeled this during the session. Eimy was engaged and motivated during the session. She expressed commitment towards our goal. A follow-up was scheduled for continued treatment.   Interventions: CBT and BA  Diagnosis:   Major depressive disorder, recurrent episode, moderate (HCC)  Generalized anxiety disorder  Psychiatric Treatment: Yes , [facility].  Treatment Plan:  Client Abilities/Strengths Devra is intelligent, forthcoming, and motivated for change .  Support System: Daughter.   Client Treatment Preferences Outpatient therapy.   Client Statement of Needs Mikah would like to more actively manage her finances, begin psychiatric treatment, improve fitness and health, reduce alcohol consumption,  spend more time outside, increase mindfulness, manage impulsivity, become more purposeful.   Treatment Level Weekly  Symptoms  Anxiety: worry, difficulty managing worry, worrying about different  things, trouble relaxing, restlessness, irritability, feeling afraid  something awful might happen.  (Status: maintained) Depression, loss of interest, feeling down, lethargy, poor appetite, trouble concentrating, psychomotor agitation, denied SI   (Status: maintained)  Goals:   Enriqueta experiences symptoms of depression, anxiety, interpersonal stressors, and ADHD.    Target Date: 12/13/23 Frequency: Weekly  Progress: 0 Modality: individual    Therapist will provide referrals for additional resources as appropriate.  Therapist will provide psycho-education regarding Jolissa's diagnosis and corresponding treatment approaches and interventions. Licensed Clinical Social Worker, [place], LCSW will support the patient's ability to achieve the goals identified. will employ CBT, BA, Problem-solving, Solution Focused, Mindfulness,  coping skills, & other evidenced-based practices will be used to promote progress towards healthy functioning to help manage decrease symptoms associated with her diagnosis.   Reduce overall level, frequency, and intensity of the feelings of depression, anxiety and panic evidenced by decreased overall symptoms from 6 to 7 days/week to 0 to 1 days/week per client report for at least 3 consecutive months. Verbally express understanding of the relationship between feelings of depression, anxiety and their impact on thinking patterns and behaviors. Verbalize an understanding of the role that distorted thinking plays in creating fears, excessive worry, and ruminations.  [name] participated in the creation of the treatment plan)   [name], LCSW

## 2024-01-02 ENCOUNTER — Ambulatory Visit: Payer: Medicare HMO | Admitting: Psychology

## 2024-01-02 DIAGNOSIS — F331 Major depressive disorder, recurrent, moderate: Secondary | ICD-10-CM

## 2024-01-02 DIAGNOSIS — F411 Generalized anxiety disorder: Secondary | ICD-10-CM

## 2024-01-02 NOTE — Progress Notes (Unsigned)
[facility] Counselor Initial Adult Exam  Name: Tanya Barron Date: 01/02/2024 MRN: [id number] DOB: January 15, 1951 PCP: Aliene Beams, MD  Time Spent: 11:06 am - 11:52 am : 46 Minutes  Guardian/Payee:  Self    Paperwork requested: No   Reason for Visit /Presenting Problem: Depression and anxiety.   Mental Status Exam: Appearance:   Neat and Well Groomed     Behavior:  Appropriate  Motor:  Normal  Speech/Language:   Clear and Coherent  Affect:  Appropriate  Mood:  normal  Thought process:  normal  Thought content:    WNL  Sensory/Perceptual disturbances:    WNL  Orientation:  oriented to person, place, time/date, and situation  Attention:  Good  Concentration:  Good  Memory:  WNL  Fund of knowledge:   Good  Insight:    Good  Judgment:   Good  Impulse Control:  Good   Reported Symptoms:  anxiety and depression.   Risk Assessment: Danger to Self:  No Self-injurious Behavior: No Danger to Others: No Duty to Warn:no Physical Aggression / Violence:No  Access to Firearms a concern: No  Gang Involvement:No  Patient / guardian was educated about steps to take if suicide or homicide risk level increases between visits: no While future psychiatric events cannot be accurately predicted, the patient does not currently require acute inpatient psychiatric care and does not currently meet [facility] involuntary commitment criteria.  Substance Abuse History: Current substance abuse: Yes     Caffeine: 1 x coffee cup Tobacco: denied.  Substance use: denied.  Alcohol Use: ~6x beers per night.    Past Psychiatric History:   Previous psychological history is significant for anxiety and depression Outpatient Providers:Verita Kuroda, LCSW - [facility]. Hx of [facility].  History of Psych Hospitalization: No  Psychological Testing:  NA    Abuse History:  Victim of: No.,  NA  , mother was narcissistic and demanding.  Report needed:  No. Victim of Neglect:No. Perpetrator of  NA   Witness / Exposure to Domestic Violence: No   Protective Services Involvement: No  Witness to [employer] Violence:  No   Family History:  Family History  Problem Relation Age of Onset   Alzheimer's disease Mother    Heart disease Father    Breast cancer Paternal Aunt     Living situation: the patient lives with their daughter  Sexual Orientation: Straight  Relationship Status: divorced  Name of spouse / other:na If a parent, number of children / ages:Tate (39) & Brynne (37)  Support Systems: lives with daughter.   Financial Stress:  Yes   Income/Employment/Disability: retired  Financial planner: No   Educational History: Education: Risk manager: NA  Any cultural differences that may affect / interfere with treatment:  not applicable   Recreation/Hobbies: painting  Stressors: Financial difficulties   Marital or family conflict   Other: daughter's "do-less"    Strengths: Hopefulness and Self Advocate  Barriers:  finances & mood.    Legal History: Pending legal issue / charges: The patient has no significant history of legal issues. History of legal issue / charges:  NA  Medical History/Surgical History: reviewed Past Medical History:  Diagnosis Date   HTN (hypertension)    Seasonal allergies     Past Surgical History:  Procedure Laterality Date   CARPAL TUNNEL RELEASE     SPINAL FUSION     TRIGGER FINGER RELEASE      Medications: Current Outpatient Medications  Medication Sig  Dispense Refill   alendronate (FOSAMAX) 70 MG tablet Take 70 mg by mouth once a week.     amLODipine (NORVASC) 5 MG tablet      aspirin EC 81 MG tablet Take 1 tablet (81 mg total) by mouth daily. Swallow whole. 90 tablet 3   atorvastatin (LIPITOR) 40 MG tablet Take 1 tablet by mouth daily.     cetirizine (ZYRTEC) 10 MG tablet Take by mouth.     pantoprazole (PROTONIX) 40 MG tablet Take 1  tablet by mouth daily.     PARoxetine (PAXIL) 40 MG tablet Take 60 mg by mouth daily.     triamterene-hydrochlorothiazide (DYAZIDE) 37.5-25 MG capsule      No current facility-administered medications for this visit.    Allergies  Allergen Reactions   Sulfamethoxazole-Trimethoprim Hives    Diagnoses:  Major depressive disorder, recurrent episode, moderate (HCC)  Generalized anxiety disorder  Psychiatric Treatment: No , previously seen at [facility].   Plan of Care: Outpatient Therapy and Psychiatric treatment.   Narrative:  Tanya Barron participated from office with Tanya Barron and consented to treatment. We reviewed the limits of confidentiality prior to the start of the evaluation. Tanya Barron expressed understanding and agreement to proceed. This is Tanya Barron's annual reassessment. She noted her current stressors include her strained relationship with her son, her own alcohol consumption, her daughter's lack of activity, and finances. She noted her relationship with her son vacillates and noted that he can be antagonistic. She noted continuing to drink ~6 beers a night and noted previously consuming 4x-6x beers during our previous evaluation. She has considered AA but has not made traction in this are. We had previously discussed the possible benefits of AA and resources were provided regarding local meetings. She noted "thinking about it" but has not made effort to attend. She noted that alcohol use is used to cope with relationship stressors and life stressors. She noted her daughter's continued sharing of the same experience over and over, which causes her distress. She noted difficulty setting boundaries with her daughter in this regard and noted feeling distress when her daughter is distressed. She was previously treated for ADHD, using non-stimulant medication, but was informed that she would not be prescribed a stimulant until she has achieved sobriety. Tanya Barron continues to struggle  to start, maintain, and complete tasks. She has difficulty engaging in non-enjoyable tasks and noted being easily distracted. She had previously completed the ASRS v1.1, which was a positive screening. She noted a need to socialize more consistently due to feeling responsible for her daughter who "would stay in her room all day" despite Tanya Barron's efforts to encourage her to meet people, get a job, and find new hobbies. Tanya Barron validated Tanya Barron's feelings and experience. Tanya Barron as intelligent, self-ware, and motivated for change. She attend treatment regularly and participated consistently. A follow-up was scheduled to create a treatment plan. Tanya Barron answered any and all questions during the evalutation.    [name], LCSW

## 2024-01-23 ENCOUNTER — Encounter: Payer: Self-pay | Admitting: Psychology

## 2024-01-23 ENCOUNTER — Ambulatory Visit: Payer: Medicare HMO | Admitting: Psychology

## 2024-01-23 DIAGNOSIS — F411 Generalized anxiety disorder: Secondary | ICD-10-CM

## 2024-01-23 DIAGNOSIS — F331 Major depressive disorder, recurrent, moderate: Secondary | ICD-10-CM

## 2024-01-23 NOTE — Progress Notes (Signed)
 [facility] Counselor/Therapist Progress Note  Patient ID: Tanya Barron, MRN: [id number]    Date: 01/23/24  Time Spent: 12:32  pm - 1:24 pm : 52 Minutes  Treatment Type: Individual Therapy.  Reported Symptoms: depression and anxiety.   Mental Status Exam: Appearance:  Neat and Well Groomed     Behavior: Appropriate  Motor: Normal  Speech/Language:  Clear and Coherent  Affect: Congruent  Mood: normal  Thought process: normal  Thought content:   WNL  Sensory/Perceptual disturbances:   WNL  Orientation: oriented to person, place, time/date, and situation  Attention: Good  Concentration: Good  Memory: WNL  Fund of knowledge:  Good  Insight:   Good  Judgment:  Good  Impulse Control: Good   Risk Assessment: Danger to Self:  No Self-injurious Behavior: No Danger to Others: No Duty to Warn:no Physical Aggression / Violence:No  Access to Firearms a concern: No  Gang Involvement:No   Subjective:   Tanya Barron participated in the session, in person in the office with the therapist, and consented to treatment Tanya Barron reviewed the events of the past week. She noted attending an AA meetings and noted "enjoying it" and noted that the meeting felt inclusive and supportive. She noted that she plans to go again and noted the benefit of being around others with similar struggles. Therapist praised Tanya Barron for her effort between sessions and encouraged continued effort in this area. She noted noticing difficulty managing her arteritis and noted that alcohol was not advised. She noted this being a new consideration". She noted feeling "inspired" after leaving the meeting. She noted struggling with alcohol as a "disease". We explored this during the session and therapist provided psycho-education.  We reviewed numerous treatment approaches including CBT, BA, Problem Solving, and Solution focused therapy. Psych-education regarding the Tanya Barron's diagnosis of Major depressive  disorder, recurrent episode, moderate (HCC)  Generalized anxiety disorder was provided during the session. We discussed Tanya Barron's goals treatment goals which includes to engage in consistent self-care (exercise, taking supplements, improving diet [mediterranean]),  problem-solving day-to-day issues, managing ADHD symptoms, increase motivation, engage in enjoyable activities, managing symptoms day to day, managing familial stressors, increasing socialization, verbalizing thoughts and feelings, and building a routine. Tanya Barron provided verbal approval of the treatment plan.    Interventions: Psycho-education & Goal Setting.   Diagnosis:   Major depressive disorder, recurrent episode, moderate (HCC)  Generalized anxiety disorder  Psychiatric Treatment: Yes , see chart.   Treatment Plan:  Client Abilities/Strengths Tanya Barron is intelligent, self-aware, and motivated for change.    Support System: Family and friends.   Client Treatment Preferences Outpatient therapy.   Client Statement of Needs Tanya Barron would like to engage in consistent self-care (exercise, taking supplements, improving diet [mediterranean]),  problem-solving day-to-day issues, managing ADHD symptoms, increase motivation, engage in enjoyable activities, managing symptoms day to day, managing familial stressors, increasing socialization, verbalizing thoughts and feelings, building a routine, engage in regular meetings and reduce drinking.   Treatment Level Weekly  Symptoms  Anxiety: feeling anxious, difficulty managing worry, worrying about different things, trouble relaxing, restlessness, irritability, and feeling afraid something awful might happen.   (Status: maintained) Depression: loss of interest, feeling down, fluctuating appetite, lethargy, fluctuating sleep, feeling bad about self, and psycho-motor retardation.   (Status: maintained)  Goals:   Tanya Barron experiences symptoms of depression and  anxiety.  Treatment plan signed and available on s-drive:  No    Target Date: 01/22/25 Frequency: Weekly  Progress: 0 Modality: individual  Therapist will provide referrals for additional resources as appropriate.  Therapist will provide psycho-education regarding [name] diagnosis and corresponding treatment approaches and interventions. [name], LCSW will support the patient's ability to achieve the goals identified. will employ CBT, BA, Problem-solving, Solution Focused, Mindfulness,  coping skills, & other evidenced-based practices will be used to promote progress towards healthy functioning to help manage decrease symptoms associated with her diagnosis.   Reduce overall level, frequency, and intensity of the feelings of depression, anxiety and evidenced by decreased overall symptoms from 6 to 7 days/week to 0 to 1 days/week per client report for at least 3 consecutive months. Verbally express understanding of the relationship between feelings of depression, anxiety and their impact on thinking patterns and behaviors. Verbalize an understanding of the role that distorted thinking plays in creating fears, excessive worry, and ruminations.  Tanya Barron participated in the creation of the treatment plan)    [name], LCSW

## 2024-02-20 ENCOUNTER — Ambulatory Visit: Payer: Medicare HMO | Admitting: Psychology

## 2024-02-20 DIAGNOSIS — H35033 Hypertensive retinopathy, bilateral: Secondary | ICD-10-CM | POA: Diagnosis not present

## 2024-02-20 DIAGNOSIS — F331 Major depressive disorder, recurrent, moderate: Secondary | ICD-10-CM

## 2024-02-20 DIAGNOSIS — H04129 Dry eye syndrome of unspecified lacrimal gland: Secondary | ICD-10-CM | POA: Diagnosis not present

## 2024-02-20 DIAGNOSIS — H524 Presbyopia: Secondary | ICD-10-CM | POA: Diagnosis not present

## 2024-02-20 DIAGNOSIS — F411 Generalized anxiety disorder: Secondary | ICD-10-CM

## 2024-02-20 NOTE — Progress Notes (Signed)
 [facility] Counselor/Therapist Progress Note  Patient ID: Tanya Barron, MRN: [id number]    Date: 02/20/24  Time Spent: 12:34  pm -  1:29 pm : 55 Minutes  Treatment Type: Individual Therapy.  Reported Symptoms: depression and anxiety.   Mental Status Exam: Appearance:  Neat and Well Groomed     Behavior: Appropriate  Motor: Normal  Speech/Language:  Clear and Coherent  Affect: Congruent  Mood: anxious and dysthymic  Thought process: normal  Thought content:   WNL  Sensory/Perceptual disturbances:   WNL  Orientation: oriented to person, place, time/date, and situation  Attention: Good  Concentration: Good  Memory: WNL  Fund of knowledge:  Good  Insight:   Good  Judgment:  Good  Impulse Control: Good   Risk Assessment: Danger to Self:  No Self-injurious Behavior: No Danger to Others: No Duty to Warn:no Physical Aggression / Violence:No  Access to Firearms a concern: No  Gang Involvement:No   Subjective:   Tanya Barron participated in the session, in person in the office with the therapist, and consented to treatment Tanya Barron reviewed the events of the past week. Tanya Barron noted her attempts to create a workout schedule with her daughter and is hoping that she can iron out a viable schedule to participate in varying activities. She noted not attending an AA meeting since our last session despite plans to do so.  She noted identifying meeting times and dates, setting a reminder, but still not attending the meetings. She noted her interest in going. Therapist highlighted Tanya Barron's negative self-talk and rigidity. We worked on exploring this during the session. We identified possible trepidation including having to "commit", "not wanting to screw up", and being "judged by others".  Therapist highlighted Tanya Barron's jumps to conclusions, chasing perfection, and focusing on the destination and not the process. She noted often playing it safe and avoiding vulnerability and  discomfort. We discussed her jumps to conclusion regarding expectations of AA, having to behave in a specific way, and how others might see her. Therapist challenged this during the session. Tanya Barron noted how difficult it would be to admit that she's an "alcoholic" and was tearful during this admission. We worked on exploring this. She noted her uncle's history of alcoholism. Therapist validated Tanya Barron's feelings of vulnerability during the session. Therapist praised Tanya Barron's efforts to reframe her feelings and experience, accept challenging from the therapist. Therapist provided supportive therapy. A follow-up was scheduled for continued treatment, which Tanya Barron benefits from.    Interventions: CBT  Diagnosis:   Major depressive disorder, recurrent episode, moderate (HCC)  Generalized anxiety disorder  Psychiatric Treatment: Yes , see chart.   Treatment Plan:  Client Abilities/Strengths Tanya Barron is intelligent, self-aware, and motivated for change.    Support System: Family and friends.   Client Treatment Preferences Outpatient therapy.   Client Statement of Needs Tanya Barron would like to engage in consistent self-care (exercise, taking supplements, improving diet [mediterranean]),  problem-solving day-to-day issues, managing ADHD symptoms, increase motivation, engage in enjoyable activities, managing symptoms day to day, managing familial stressors, increasing socialization, verbalizing thoughts and feelings, building a routine, engage in regular meetings and reduce drinking.   Treatment Level Weekly  Symptoms  Anxiety: feeling anxious, difficulty managing worry, worrying about different things, trouble relaxing, restlessness, irritability, and feeling afraid something awful might happen.   (Status: maintained) Depression: loss of interest, feeling down, fluctuating appetite, lethargy, fluctuating sleep, feeling bad about self, and psycho-motor retardation.   (Status: maintained)  Goals:    Tanya Barron experiences symptoms of  depression and anxiety.  Treatment plan signed and available on s-drive:  No    Target Date: 01/22/25 Frequency: Weekly  Progress: 10% Modality: individual    Therapist will provide referrals for additional resources as appropriate.  Therapist will provide psycho-education regarding Tanya Barron's diagnosis and corresponding treatment approaches and interventions. [name], LCSW will support the patient's ability to achieve the goals identified. will employ CBT, BA, Problem-solving, Solution Focused, Mindfulness,  coping skills, & other evidenced-based practices will be used to promote progress towards healthy functioning to help manage decrease symptoms associated with her diagnosis.   Reduce overall level, frequency, and intensity of the feelings of depression, anxiety and evidenced by decreased overall symptoms from 6 to 7 days/week to 0 to 1 days/week per client report for at least 3 consecutive months. Verbally express understanding of the relationship between feelings of depression, anxiety and their impact on thinking patterns and behaviors. Verbalize an understanding of the role that distorted thinking plays in creating fears, excessive worry, and ruminations.  Tanya Barron participated in the creation of the treatment plan)    [name], LCSW

## 2024-03-12 ENCOUNTER — Ambulatory Visit (INDEPENDENT_AMBULATORY_CARE_PROVIDER_SITE_OTHER): Admitting: Psychology

## 2024-03-12 DIAGNOSIS — F411 Generalized anxiety disorder: Secondary | ICD-10-CM

## 2024-03-12 DIAGNOSIS — F331 Major depressive disorder, recurrent, moderate: Secondary | ICD-10-CM | POA: Diagnosis not present

## 2024-03-12 NOTE — Progress Notes (Signed)
 [facility] Counselor/Therapist Progress Note  Patient ID: Tanya Barron, MRN: [id number]    Date: 03/12/24  Time Spent: 12:29  pm -  1:28  pm : 59 Minutes  Treatment Type: Individual Therapy.  Reported Symptoms: depression and anxiety.   Mental Status Exam: Appearance:  Neat and Well Groomed     Behavior: Appropriate  Motor: Normal  Speech/Language:  Clear and Coherent  Affect: Congruent  Mood: anxious and dysthymic  Thought process: normal  Thought content:   WNL  Sensory/Perceptual disturbances:   WNL  Orientation: oriented to person, place, time/date, and situation  Attention: Good  Concentration: Good  Memory: WNL  Fund of knowledge:  Good  Insight:   Good  Judgment:  Good  Impulse Control: Good   Risk Assessment: Danger to Self:  No Self-injurious Behavior: No Danger to Others: No Duty to Warn:no Physical Aggression / Violence:No  Access to Firearms a concern: No  Gang Involvement:No   Subjective:   Tanya Barron participated in the session, in person in the office with the therapist, and consented to treatment Tanya Barron reviewed the events of the past week. Tanya Barron noted recently attempting to discontinue beer drinking and drink non-alcoholic beer "coors edge" but noted starting with this and beginning to identify ways to balance that with beer drinking and finding various arguments to maintain her drinking. Tanya Barron noted continued barriers to attending an AA meetings since our last appointment. Tanya Barron reflected on attending two meeting in total. We explored this in relation to her commitment to attend appointments between sessions and her interest in participating. We worked on identifying possible barriers. Tanya Barron noted "I should be able to do this by myself". Tanya Barron noted the conflict of wanting both to quit and continue to drink. We worked on identifying how Tanya Barron might feel about self if Tanya Barron attended regularly. Tanya Barron noted feeling that those who are highlighting her  drinking as being unaccepting of her as a person and how Tanya Barron isn't harming anyone. We worked on exploring and challenging this, during the session. We identified Tanya Barron's frustration regarding feeling "forced" by her family. We worked on identifying any possible evidence for this and were unable to but did identify pressure from her children. We worked on identifying her own motivation for discontinuing her drinking. We discussed the difficulty in making life changes and discussed identifying what this change might be like. Therapist validated Tanya Barron's feelings and experience, challenged negative cognitions and distortions, and provided supportive therapy. A follow-up was scheduled for continued treatment.   Interventions: CBT  Diagnosis:   Major depressive disorder, recurrent episode, moderate (HCC)  Generalized anxiety disorder  Psychiatric Treatment: Yes , see chart.   Treatment Plan:  Client Abilities/Strengths Tanya Barron is intelligent, self-aware, and motivated for change.    Support System: Family and friends.   Client Treatment Preferences Outpatient therapy.   Client Statement of Needs Tanya Barron would like to engage in consistent self-care (exercise, taking supplements, improving diet [mediterranean]),  problem-solving day-to-day issues, managing ADHD symptoms, increase motivation, engage in enjoyable activities, managing symptoms day to day, managing familial stressors, increasing socialization, verbalizing thoughts and feelings, building a routine, engage in regular meetings and reduce drinking.   Treatment Level Weekly  Symptoms  Anxiety: feeling anxious, difficulty managing worry, worrying about different things, trouble relaxing, restlessness, irritability, and feeling afraid something awful might happen.   (Status: maintained) Depression: loss of interest, feeling down, fluctuating appetite, lethargy, fluctuating sleep, feeling bad about self, and psycho-motor retardation.    (Status: maintained)  Goals:   Tanya Barron experiences symptoms of depression and anxiety.  Treatment plan signed and available on s-drive:  No    Target Date: 01/22/25 Frequency: Weekly  Progress: 10% Modality: individual    Therapist will provide referrals for additional resources as appropriate.  Therapist will provide psycho-education regarding Tanya Barron's diagnosis and corresponding treatment approaches and interventions. Belva Boyden, LCSW will support the patient's ability to achieve the goals identified. will employ CBT, BA, Problem-solving, Solution Focused, Mindfulness,  coping skills, & other evidenced-based practices will be used to promote progress towards healthy functioning to help manage decrease symptoms associated with her diagnosis.   Reduce overall level, frequency, and intensity of the feelings of depression, anxiety and evidenced by decreased overall symptoms from 6 to 7 days/week to 0 to 1 days/week per client report for at least 3 consecutive months. Verbally express understanding of the relationship between feelings of depression, anxiety and their impact on thinking patterns and behaviors. Verbalize an understanding of the role that distorted thinking plays in creating fears, excessive worry, and ruminations.  Tanya Barron participated in the creation of the treatment plan)    Belva Boyden, LCSW

## 2024-03-22 DIAGNOSIS — J069 Acute upper respiratory infection, unspecified: Secondary | ICD-10-CM | POA: Diagnosis not present

## 2024-03-22 DIAGNOSIS — R051 Acute cough: Secondary | ICD-10-CM | POA: Diagnosis not present

## 2024-03-22 DIAGNOSIS — N952 Postmenopausal atrophic vaginitis: Secondary | ICD-10-CM | POA: Diagnosis not present

## 2024-03-24 DIAGNOSIS — J411 Mucopurulent chronic bronchitis: Secondary | ICD-10-CM | POA: Diagnosis not present

## 2024-03-24 DIAGNOSIS — J329 Chronic sinusitis, unspecified: Secondary | ICD-10-CM | POA: Diagnosis not present

## 2024-04-02 ENCOUNTER — Ambulatory Visit (INDEPENDENT_AMBULATORY_CARE_PROVIDER_SITE_OTHER): Admitting: Psychology

## 2024-04-02 DIAGNOSIS — F411 Generalized anxiety disorder: Secondary | ICD-10-CM | POA: Diagnosis not present

## 2024-04-02 DIAGNOSIS — F410 Panic disorder [episodic paroxysmal anxiety] without agoraphobia: Secondary | ICD-10-CM | POA: Diagnosis not present

## 2024-04-02 DIAGNOSIS — F331 Major depressive disorder, recurrent, moderate: Secondary | ICD-10-CM | POA: Diagnosis not present

## 2024-04-02 NOTE — Progress Notes (Signed)
 [facility] Counselor/Therapist Progress Note  Patient ID: Tanya Barron, MRN: [id number]    Date: 04/02/24  Time Spent: 1:34 pm -  2:16  pm : 42 Minutes  Treatment Type: Individual Therapy.  Reported Symptoms: depression and anxiety.   Mental Status Exam: Appearance:  Neat and Well Groomed     Behavior: Appropriate  Motor: Normal  Speech/Language:  Clear and Coherent  Affect: Congruent  Mood: dysthymic  Thought process: normal  Thought content:   WNL  Sensory/Perceptual disturbances:   WNL  Orientation: oriented to person, place, time/date, and situation  Attention: Good  Concentration: Good  Memory: WNL  Fund of knowledge:  Good  Insight:   Good  Judgment:  Good  Impulse Control: Good   Risk Assessment: Danger to Self:  No Self-injurious Behavior: No Danger to Others: No Duty to Warn:no Physical Aggression / Violence:No  Access to Firearms a concern: No  Gang Involvement:No   Subjective:   Tanya Barron participated in the session, in person in the office with the therapist, and consented to treatment Tanya Barron reviewed the events of the past week.She noted feeling sick during the past week. She noting being hopeful to be fully recovered soon. She noted that she has not attended an AA meeting yet but noted hopes that she will attend soon. She noted drinking much less alcohol since she has been recovering from her health issues and noted that her drinking, currently, is to "pass the time". She noted her social life being "non-existent" and noted interest in increasing her support. She noted previously being a part of a community theater and noted her interest in re-engaging in this.We set this as a goal between sessions. Additional goals include beginning to read more, attending an AA meeting, and becoming more physically active. She noted often wondering "what am I looking for?" We worked on exploring this during the session and therapist highlighted Tanya Barron's  over thinking and over planning regarding various activities and tasks and identified the barriers this can result in, in regards to engagement. We worked on processing this and identifying a framework to aid in decision-making. We discussed the importance of setting boundaries in this area, focusing on the process and not the result, and using data and evidence to guide decision-making. Tanya Barron was engaged and motivated during the session.  She expressed commitment towards goals. Therapist praised Tanya Barron for her effort and energy and provided supportive therapy. A follow-up was scheduled for continued treatment which Tanya Barron benefits from.   Interventions: CBT  Diagnosis:   Major depressive disorder, recurrent episode, moderate (HCC)  Generalized anxiety disorder  Panic attacks  Psychiatric Treatment: Yes , see chart.   Treatment Plan:  Client Abilities/Strengths Tanya Barron is intelligent, self-aware, and motivated for change.    Support System: Family and friends.   Client Treatment Preferences Outpatient therapy.   Client Statement of Needs Tanya Barron would like to engage in consistent self-care (exercise, taking supplements, improving diet [mediterranean]),  problem-solving day-to-day issues, managing ADHD symptoms, increase motivation, engage in enjoyable activities, managing symptoms day to day, managing familial stressors, increasing socialization, verbalizing thoughts and feelings, building a routine, engage in regular meetings and reduce drinking.   Treatment Level Weekly  Symptoms  Anxiety: feeling anxious, difficulty managing worry, worrying about different things, trouble relaxing, restlessness, irritability, and feeling afraid something awful might happen.   (Status: maintained) Depression: loss of interest, feeling down, fluctuating appetite, lethargy, fluctuating sleep, feeling bad about self, and psycho-motor retardation.   (Status: maintained)  Goals:  Tanya Barron experiences  symptoms of depression and anxiety.  Treatment plan signed and available on s-drive:  No    Target Date: 01/22/25 Frequency: Weekly  Progress: 10% Modality: individual    Therapist will provide referrals for additional resources as appropriate.  Therapist will provide psycho-education regarding Tanya Barron's diagnosis and corresponding treatment approaches and interventions. Belva Boyden, LCSW will support the patient's ability to achieve the goals identified. will employ CBT, BA, Problem-solving, Solution Focused, Mindfulness,  coping skills, & other evidenced-based practices will be used to promote progress towards healthy functioning to help manage decrease symptoms associated with her diagnosis.   Reduce overall level, frequency, and intensity of the feelings of depression, anxiety and evidenced by decreased overall symptoms from 6 to 7 days/week to 0 to 1 days/week per client report for at least 3 consecutive months. Verbally express understanding of the relationship between feelings of depression, anxiety and their impact on thinking patterns and behaviors. Verbalize an understanding of the role that distorted thinking plays in creating fears, excessive worry, and ruminations.  Tanya Barron participated in the creation of the treatment plan)    Belva Boyden, LCSW

## 2024-04-30 ENCOUNTER — Ambulatory Visit (INDEPENDENT_AMBULATORY_CARE_PROVIDER_SITE_OTHER): Admitting: Psychology

## 2024-04-30 DIAGNOSIS — F331 Major depressive disorder, recurrent, moderate: Secondary | ICD-10-CM

## 2024-04-30 DIAGNOSIS — F411 Generalized anxiety disorder: Secondary | ICD-10-CM

## 2024-04-30 NOTE — Progress Notes (Signed)
 [facility] Counselor/Therapist Progress Note  Patient ID: Tanya Barron, MRN: [id number]    Date: 04/30/24  Time Spent: 12:33 pm -  1:24  pm : 51 Minutes  Treatment Type: Individual Therapy.  Reported Symptoms: depression and anxiety.   Mental Status Exam: Appearance:  Neat and Well Groomed     Behavior: Appropriate  Motor: Normal  Speech/Language:  Clear and Coherent  Affect: Congruent  Mood: dysthymic  Thought process: normal  Thought content:   WNL  Sensory/Perceptual disturbances:   WNL  Orientation: oriented to person, place, time/date, and situation  Attention: Good  Concentration: Good  Memory: WNL  Fund of knowledge:  Good  Insight:   Good  Judgment:  Good  Impulse Control: Good   Risk Assessment: Danger to Self:  No Self-injurious Behavior: No Danger to Others: No Duty to Warn:no Physical Aggression / Violence:No  Access to Firearms a concern: No  Gang Involvement:No   Subjective:   Tanya Barron participated in the session, in person in the office with the therapist, and consented to treatment Tanya Barron reviewed the events of the past week.Tanya Barron noted the events of the past two weeks. She noted attending an AA meeting ~ I week ago. She noted reaching out to a group member she previously met months ago and confirmed that the meeting was still in place. She noted reading out loud during the meeting. She noted acknowledging her alcoholism during the meeting. She noted having a significant disagreement with her son. She noted her son making a comment about worrying about her and she noted that she exploded. She noted this affecting her sleep for two nights and noted her son not contacting her for babysitting after initially mentioning it. She noted working acceptance regarding her son's behavior. She noted the comments that her son made were very hurtful. She noted her son's inaccurate recall of childhood events and her difficulty engaging with  her son regarding this despite her efforts. Therapist praised Tanya Barron for her effort to attend an AA meeting and participate. She noted that she often uses uses and sometimes abuses alcohol. She reflected on her experience in the meeting. She noted that it it's not as intimidating as I thought. She noted her intent to go to a meeting this week on Wednesday but noted difficulty with the idea of committing. We worked on defining this during the session. She noted that her increase in exercise led to her working on attending meetings. Therapist praised Tanya Barron for her effort to address her health and mood. Therapist encouraged continued effort in this area. Therapist validated Tanya Barron's feelings and experience during the session. Therapist praised Tanya Barron for her effort during the session, provided supportive therapy. A follow-up was scheduled for continued treatment. A follow-up was scheduled for continued treatment which Tanya Barron benefits from.   Interventions: CBT & interpersonal.   Diagnosis:   Major depressive disorder, recurrent episode, moderate (HCC)  Generalized anxiety disorder  Psychiatric Treatment: Yes , see chart.   Treatment Plan:  Client Abilities/Strengths Tanya Barron is intelligent, self-aware, and motivated for change.    Support System: Family and friends.   Client Treatment Preferences Outpatient therapy.   Client Statement of Needs Tanya Barron would like to engage in consistent self-care (exercise, taking supplements, improving diet [mediterranean]),  problem-solving day-to-day issues, managing ADHD symptoms, increase motivation, engage in enjoyable activities, managing symptoms day to day, managing familial stressors, increasing socialization, verbalizing thoughts and feelings, building a routine, engage in regular meetings and reduce drinking.   Treatment Level  Weekly  Symptoms  Anxiety: feeling anxious, difficulty managing worry, worrying about different things, trouble  relaxing, restlessness, irritability, and feeling afraid something awful might happen.   (Status: maintained) Depression: loss of interest, feeling down, fluctuating appetite, lethargy, fluctuating sleep, feeling bad about self, and psycho-motor retardation.   (Status: maintained)  Goals:   Tanya Barron experiences symptoms of depression and anxiety.  Treatment plan signed and available on s-drive:  No    Target Date: 01/22/25 Frequency: Weekly  Progress: 10% Modality: individual    Therapist will provide referrals for additional resources as appropriate.  Therapist will provide psycho-education regarding Tanya Barron's diagnosis and corresponding treatment approaches and interventions. Tanya Boyden, LCSW will support the patient's ability to achieve the goals identified. will employ CBT, BA, Problem-solving, Solution Focused, Mindfulness,  coping skills, & other evidenced-based practices will be used to promote progress towards healthy functioning to help manage decrease symptoms associated with her diagnosis.   Reduce overall level, frequency, and intensity of the feelings of depression, anxiety and evidenced by decreased overall symptoms from 6 to 7 days/week to 0 to 1 days/week per client report for at least 3 consecutive months. Verbally express understanding of the relationship between feelings of depression, anxiety and their impact on thinking patterns and behaviors. Verbalize an understanding of the role that distorted thinking plays in creating fears, excessive worry, and ruminations.  Tanya Barron participated in the creation of the treatment plan)    Tanya Boyden, LCSW

## 2024-05-08 DIAGNOSIS — M67912 Unspecified disorder of synovium and tendon, left shoulder: Secondary | ICD-10-CM | POA: Diagnosis not present

## 2024-05-08 DIAGNOSIS — M67911 Unspecified disorder of synovium and tendon, right shoulder: Secondary | ICD-10-CM | POA: Diagnosis not present

## 2024-05-15 DIAGNOSIS — M7542 Impingement syndrome of left shoulder: Secondary | ICD-10-CM | POA: Diagnosis not present

## 2024-05-15 DIAGNOSIS — M7541 Impingement syndrome of right shoulder: Secondary | ICD-10-CM | POA: Diagnosis not present

## 2024-05-28 ENCOUNTER — Ambulatory Visit (INDEPENDENT_AMBULATORY_CARE_PROVIDER_SITE_OTHER): Admitting: Psychology

## 2024-05-28 DIAGNOSIS — F411 Generalized anxiety disorder: Secondary | ICD-10-CM

## 2024-05-28 DIAGNOSIS — F410 Panic disorder [episodic paroxysmal anxiety] without agoraphobia: Secondary | ICD-10-CM

## 2024-05-28 DIAGNOSIS — F331 Major depressive disorder, recurrent, moderate: Secondary | ICD-10-CM | POA: Diagnosis not present

## 2024-05-28 NOTE — Progress Notes (Signed)
 [facility] Counselor/Therapist Progress Note  Patient ID: Tanya Barron, MRN: [id number]    Date: 05/28/24  Time Spent: 12:36 pm - 1:35 pm : 59    Minutes  Treatment Type: Individual Therapy.  Reported Symptoms: depression and anxiety.   Mental Status Exam: Appearance:  Neat and Well Groomed     Behavior: Appropriate  Motor: Normal  Speech/Language:  Clear and Coherent  Affect: Congruent  Mood: dysthymic  Thought process: normal  Thought content:   WNL  Sensory/Perceptual disturbances:   WNL  Orientation: oriented to person, place, time/date, and situation  Attention: Good  Concentration: Good  Memory: WNL  Fund of knowledge:  Good  Insight:   Good  Judgment:  Good  Impulse Control: Good   Risk Assessment: Danger to Self:  No Self-injurious Behavior: No Danger to Others: No Duty to Warn:no Physical Aggression / Violence:No  Access to Firearms a concern: No  Gang Involvement:No   Subjective:   [name] participated in the session, in person in the office with the therapist, and consented to treatment Haelie reviewed the events of the past week.Verle noted the events of the past two weeks. Dezi noted that she has not attended AA since our last session. She noted continued strain with her son and discussed her various attempts to address this issue while maintaining her boundaries. She noted her son's antagonism towards various family members including her. She noted a lack of positive outlook in regards to the dynamics. She noted her son's overly critical approach including towards his children. Anabella noted difficulty with this. We worked on exploring this during the session. We identified areas of control and lack of control. We processed her feelings during the session. She noted some positives including participating in an art class and is working trying various types of drawing and painting. She is intending on continuing to participate in her  classes and noted enjoying it. She noted that her drinking at night is much more ingrained than I am willing to acknowledge. We worked on processing this during the session. She noted that her drinking is the little pleasure that I can count on, what's wrong with it. She noted that opposing self-talk what if it's really bad for you. She noted her attempts to often rationalize her drinking. We worked on processing this during the session. We worked on highlighting the risks and cost to her including effects on health, mood, mental health treatment for possible ADHD diagnosis.  Reighan noted a feeling of panic when discussing sobriety during the session. We will continue to explore this going forward. Therapist encouraged introspection regarding this. Additionally, therapist encouraged Etha to attend 1 AA meeting, every week, between sessions. We discussed the importance of not setting expectations for self and others, being open to learn more about the 12-step program, and to work on building comfort with attendance. Karry was receptive to this and provided her verbal commitment towards this. Therapist validated Diasha's feelings and experience and provided supportive therapy. A follow-up was scheduled for continued treatment, which she benefits from.   Interventions: CBT & interpersonal.   Diagnosis:   Major depressive disorder, recurrent episode, moderate (HCC)  Generalized anxiety disorder  Panic attacks  Psychiatric Treatment: Yes , see chart.   Treatment Plan:  Client Abilities/Strengths Shalayah is intelligent, self-aware, and motivated for change.    Support System: Family and friends.   Client Treatment Preferences Outpatient therapy.   Client Statement of Needs Yanil would like to engage in consistent  self-care (exercise, taking supplements, improving diet [mediterranean]),  problem-solving day-to-day issues, managing ADHD symptoms, increase motivation, engage in enjoyable  activities, managing symptoms day to day, managing familial stressors, increasing socialization, verbalizing thoughts and feelings, building a routine, engage in regular meetings and reduce drinking.   Treatment Level Weekly  Symptoms  Anxiety: feeling anxious, difficulty managing worry, worrying about different things, trouble relaxing, restlessness, irritability, and feeling afraid something awful might happen.   (Status: maintained) Depression: loss of interest, feeling down, fluctuating appetite, lethargy, fluctuating sleep, feeling bad about self, and psycho-motor retardation.   (Status: maintained)  Goals:   Nitzia experiences symptoms of depression and anxiety.  Treatment plan signed and available on s-drive:  No    Target Date: 01/22/25 Frequency: Weekly  Progress: 10% Modality: individual    Therapist will provide referrals for additional resources as appropriate.  Therapist will provide psycho-education regarding Portia's diagnosis and corresponding treatment approaches and interventions. [name], LCSW will support the patient's ability to achieve the goals identified. will employ CBT, BA, Problem-solving, Solution Focused, Mindfulness,  coping skills, & other evidenced-based practices will be used to promote progress towards healthy functioning to help manage decrease symptoms associated with her diagnosis.   Reduce overall level, frequency, and intensity of the feelings of depression, anxiety and evidenced by decreased overall symptoms from 6 to 7 days/week to 0 to 1 days/week per client report for at least 3 consecutive months. Verbally express understanding of the relationship between feelings of depression, anxiety and their impact on thinking patterns and behaviors. Verbalize an understanding of the role that distorted thinking plays in creating fears, excessive worry, and ruminations.  [name] participated in the creation of the treatment plan)    [name],  LCSW

## 2024-05-31 DIAGNOSIS — M7542 Impingement syndrome of left shoulder: Secondary | ICD-10-CM | POA: Diagnosis not present

## 2024-05-31 DIAGNOSIS — M7541 Impingement syndrome of right shoulder: Secondary | ICD-10-CM | POA: Diagnosis not present

## 2024-06-05 DIAGNOSIS — M7541 Impingement syndrome of right shoulder: Secondary | ICD-10-CM | POA: Diagnosis not present

## 2024-06-05 DIAGNOSIS — M7542 Impingement syndrome of left shoulder: Secondary | ICD-10-CM | POA: Diagnosis not present

## 2024-06-07 DIAGNOSIS — M7541 Impingement syndrome of right shoulder: Secondary | ICD-10-CM | POA: Diagnosis not present

## 2024-06-07 DIAGNOSIS — M7542 Impingement syndrome of left shoulder: Secondary | ICD-10-CM | POA: Diagnosis not present

## 2024-06-11 DIAGNOSIS — M67911 Unspecified disorder of synovium and tendon, right shoulder: Secondary | ICD-10-CM | POA: Diagnosis not present

## 2024-06-21 DIAGNOSIS — M25511 Pain in right shoulder: Secondary | ICD-10-CM | POA: Diagnosis not present

## 2024-06-25 ENCOUNTER — Ambulatory Visit: Admitting: Psychology

## 2024-06-25 DIAGNOSIS — F331 Major depressive disorder, recurrent, moderate: Secondary | ICD-10-CM | POA: Diagnosis not present

## 2024-06-25 DIAGNOSIS — F410 Panic disorder [episodic paroxysmal anxiety] without agoraphobia: Secondary | ICD-10-CM | POA: Diagnosis not present

## 2024-06-25 DIAGNOSIS — F411 Generalized anxiety disorder: Secondary | ICD-10-CM

## 2024-06-25 NOTE — Progress Notes (Signed)
 [facility] Counselor/Therapist Progress Note  Patient ID: Tanya Barron, MRN: [id number]    Date: 06/25/24  Time Spent: 12:33 pm - 1:27 pm : 54    Minutes  Treatment Type: Individual Therapy.  Reported Symptoms: depression and anxiety.   Mental Status Exam: Appearance:  Neat and Well Groomed     Behavior: Appropriate  Motor: Normal  Speech/Language:  Clear and Coherent  Affect: Congruent  Mood: dysthymic  Thought process: normal  Thought content:   WNL  Sensory/Perceptual disturbances:   WNL  Orientation: oriented to person, place, time/date, and situation  Attention: Good  Concentration: Good  Memory: WNL  Fund of knowledge:  Good  Insight:   Good  Judgment:  Good  Impulse Control: Good   Risk Assessment: Danger to Self:  No Self-injurious Behavior: No Danger to Others: No Duty to Warn:no Physical Aggression / Violence:No  Access to Firearms a concern: No  Gang Involvement:No   Subjective:   Tanya Barron participated in the session, in person in the office with the therapist, and consented to treatment Tanya Barron reviewed the events of the past week.Tanya Barron noted the events of the past two weeks. Tanya Barron noted not making it to any meetings and noted being busy with various life events and occasions. Tanya Barron noted being negatively affected by the political scene. Tanya Barron described this past month as a blah month.  Tanya Barron noted her feelings regarding the recent political events and noted difficulty making sense of it. Tanya Barron noted the high frequency of major calamities on a weekly basis. Tanya Barron noted the difficulty with being optimistic about stuff. Tanya Barron noted the stressors related to this including financial pressures on her and others. Tanya Barron noted this being disheartening. We worked on exploring her coping. Tanya Barron noted communicating with her support systems how Tanya Barron feels but noted the benefit of expanding her support circle. Tanya Barron noted the beer has been available. Tanya Barron  noted her unsuccessful attempts to avoid the news. Tanya Barron noted this being difficult and noted having various news apps on her phone. Tanya Barron noted using books as a distractor but currently reading a book that is centered on the effect of immigration and the separation of parents and children. We discussed the importance of engaging in activities and behaviors that support our goals for mood and functioning. Tanya Barron noted difficulty about asking for help from others. Tanya Barron noted as a single parent, you feel like you have to be in control at all times. Tanya Barron noted starting her third act and having to come to grips with the possibility that this wouldn't look like what Tanya Barron wants. Tanya Barron noted struggling to balance her responsibility for others and self. We will work explore this during our follow-up. Therapist encouraged Tanya Barron to work on delineating this between sessions.  Tanya Barron was receptive to this during the session. Tanya Barron was engaged and motivated during the session. Tanya Barron expressed commitment towards goals.    Interventions: CBT & interpersonal.   Diagnosis:   Major depressive disorder, recurrent episode, moderate (HCC)  Generalized anxiety disorder  Panic attacks  Psychiatric Treatment: Yes , see chart.   Treatment Plan:  Client Abilities/Strengths Tanya Barron is intelligent, self-aware, and motivated for change.    Support System: Family and friends.   Client Treatment Preferences Outpatient therapy.   Client Statement of Needs Tanya Barron would like to engage in consistent self-care (exercise, taking supplements, improving diet [mediterranean]),  problem-solving day-to-day issues, managing ADHD symptoms, increase motivation, engage in enjoyable activities, managing symptoms day to day, managing familial stressors, increasing  socialization, verbalizing thoughts and feelings, building a routine, engage in regular meetings and reduce drinking.   Treatment Level Weekly  Symptoms  Anxiety: feeling  anxious, difficulty managing worry, worrying about different things, trouble relaxing, restlessness, irritability, and feeling afraid something awful might happen.   (Status: maintained) Depression: loss of interest, feeling down, fluctuating appetite, lethargy, fluctuating sleep, feeling bad about self, and psycho-motor retardation.   (Status: maintained)  Goals:   Tanya Barron experiences symptoms of depression and anxiety.  Treatment plan signed and available on s-drive:  No    Target Date: 01/22/25 Frequency: Weekly  Progress: 10% Modality: individual    Therapist will provide referrals for additional resources as appropriate.  Therapist will provide psycho-education regarding Tanya Barron's diagnosis and corresponding treatment approaches and interventions. Tanya Mullet, LCSW will support the patient's ability to achieve the goals identified. will employ CBT, BA, Problem-solving, Solution Focused, Mindfulness,  coping skills, & other evidenced-based practices will be used to promote progress towards healthy functioning to help manage decrease symptoms associated with her diagnosis.   Reduce overall level, frequency, and intensity of the feelings of depression, anxiety and evidenced by decreased overall symptoms from 6 to 7 days/week to 0 to 1 days/week per client report for at least 3 consecutive months. Verbally express understanding of the relationship between feelings of depression, anxiety and their impact on thinking patterns and behaviors. Verbalize an understanding of the role that distorted thinking plays in creating fears, excessive worry, and ruminations.  [name] participated in the creation of the treatment plan)    Tanya Mullet, LCSW

## 2024-06-25 NOTE — Progress Notes (Signed)
 [facility] Counselor/Therapist Progress Note  Patient ID: Tanya Barron, MRN: [id number]    Date: 06/25/24  Time Spent: 12:33 pm - 1:27 pm : 54    Minutes  Treatment Type: Individual Therapy.  Reported Symptoms: depression and anxiety.   Mental Status Exam: Appearance:  Neat and Well Groomed     Behavior: Appropriate  Motor: Normal  Speech/Language:  Clear and Coherent  Affect: Congruent  Mood: dysthymic  Thought process: normal  Thought content:   WNL  Sensory/Perceptual disturbances:   WNL  Orientation: oriented to person, place, time/date, and situation  Attention: Good  Concentration: Good  Memory: WNL  Fund of knowledge:  Good  Insight:   Good  Judgment:  Good  Impulse Control: Good   Risk Assessment: Danger to Self:  No Self-injurious Behavior: No Danger to Others: No Duty to Warn:no Physical Aggression / Violence:No  Access to Firearms a concern: No  Gang Involvement:No   Subjective:   Tanya Barron participated in the session, in person in the office with the therapist, and consented to treatment Tanya Barron reviewed the events of the past week.Tanya Barron noted the events of the past two weeks. Tanya Barron noted not making it to any meetings and noted being busy with various life events and occasions. Tanya Barron noted being negatively affected by the political scene. Tanya Barron described this past month as a blah month.  Tanya Barron noted her feelings regarding the recent political events and noted difficulty making sense of it. Tanya Barron noted the high frequency of major calamities on a weekly basis. Tanya Barron noted the difficulty with being optimistic about stuff. Tanya Barron noted the stressors related to this including financial pressures on her and others. Tanya Barron noted this being disheartening. We worked on exploring her coping. Tanya Barron noted communicating with her support systems how Tanya Barron feels but noted the benefit of expanding her support circle. Tanya Barron noted the beer has been available. Tanya Barron  noted her unsuccessful attempts to avoid the news. Tanya Barron noted this being difficult and noted having various news apps on her phone. Tanya Barron noted using books as a distractor but currently reading a book that is centered on the effect of immigration and the separation of parents and children. We discussed the importance of engaging in activities and behaviors that support our goals for mood and functioning. Tanya Barron noted difficulty about asking for help from others. Tanya Barron noted as a single parent, you feel like you have to be in control at all times. Tanya Barron noted starting her third act and having to come to grips with the possibility that this wouldn't look like what Tanya Barron wants. Tanya Barron noted struggling to balance her responsibility for others and self. We will work explore this during our follow-up. Therapist encouraged Tanya Barron to work on delineating this between sessions.  Tanya Barron was receptive to this during the session. Tanya Barron was engaged and motivated during the session. Tanya Barron expressed commitment towards goals.    Interventions: CBT & interpersonal.   Diagnosis:   Major depressive disorder, recurrent episode, moderate (HCC)  Generalized anxiety disorder  Panic attacks  Psychiatric Treatment: Yes , see chart.   Treatment Plan:  Client Abilities/Strengths Tanya Barron is intelligent, self-aware, and motivated for change.    Support System: Family and friends.   Client Treatment Preferences Outpatient therapy.   Client Statement of Needs Tanya Barron would like to engage in consistent self-care (exercise, taking supplements, improving diet [mediterranean]),  problem-solving day-to-day issues, managing ADHD symptoms, increase motivation, engage in enjoyable activities, managing symptoms day to day, managing familial stressors, increasing  socialization, verbalizing thoughts and feelings, building a routine, engage in regular meetings and reduce drinking.   Treatment Level Weekly  Symptoms  Anxiety: feeling  anxious, difficulty managing worry, worrying about different things, trouble relaxing, restlessness, irritability, and feeling afraid something awful might happen.   (Status: maintained) Depression: loss of interest, feeling down, fluctuating appetite, lethargy, fluctuating sleep, feeling bad about self, and psycho-motor retardation.   (Status: maintained)  Goals:   Tanya Barron experiences symptoms of depression and anxiety.  Treatment plan signed and available on s-drive:  No    Target Date: 01/22/25 Frequency: Weekly  Progress: 10% Modality: individual    Therapist will provide referrals for additional resources as appropriate.  Therapist will provide psycho-education regarding Tanya Barron's diagnosis and corresponding treatment approaches and interventions. [name], LCSW will support the patient's ability to achieve the goals identified. will employ CBT, BA, Problem-solving, Solution Focused, Mindfulness,  coping skills, & other evidenced-based practices will be used to promote progress towards healthy functioning to help manage decrease symptoms associated with her diagnosis.   Reduce overall level, frequency, and intensity of the feelings of depression, anxiety and evidenced by decreased overall symptoms from 6 to 7 days/week to 0 to 1 days/week per client report for at least 3 consecutive months. Verbally express understanding of the relationship between feelings of depression, anxiety and their impact on thinking patterns and behaviors. Verbalize an understanding of the role that distorted thinking plays in creating fears, excessive worry, and ruminations.  Tanya Barron participated in the creation of the treatment plan)    [name], LCSW

## 2024-07-23 ENCOUNTER — Ambulatory Visit: Admitting: Psychology

## 2024-07-23 DIAGNOSIS — F411 Generalized anxiety disorder: Secondary | ICD-10-CM

## 2024-07-23 DIAGNOSIS — F410 Panic disorder [episodic paroxysmal anxiety] without agoraphobia: Secondary | ICD-10-CM

## 2024-07-23 DIAGNOSIS — F331 Major depressive disorder, recurrent, moderate: Secondary | ICD-10-CM

## 2024-07-23 NOTE — Progress Notes (Signed)
 [facility] Counselor/Therapist Progress Note  Patient ID: Tanya Barron, MRN: [id number]    Date: 07/23/24  Time Spent: 12:33 pm - 1:24 pm : 51 Minutes  Treatment Type: Individual Therapy.  Reported Symptoms: depression and anxiety.   Mental Status Exam: Appearance:  Neat and Well Groomed     Behavior: Appropriate  Motor: Normal  Speech/Language:  Clear and Coherent  Affect: Congruent  Mood: dysthymic  Thought process: normal  Thought content:   WNL  Sensory/Perceptual disturbances:   WNL  Orientation: oriented to person, place, time/date, and situation  Attention: Good  Concentration: Good  Memory: WNL  Fund of knowledge:  Good  Insight:   Good  Judgment:  Good  Impulse Control: Good   Risk Assessment: Danger to Self:  No Self-injurious Behavior: No Danger to Others: No Duty to Warn:no Physical Aggression / Violence:No  Access to Firearms a concern: No  Gang Involvement:No   Subjective:   Tanya Barron participated in the session, in person in the office with the therapist, and consented to treatment Tanya Barron reviewed the events of the past week.Tanya Barron noted her interest in discontinuing drinking and noted her intent to attend a meeting. She noted often reviewing the obituaries from her home town and began to think about how her Tanya Barron would look and noted this being upsetting to reflect on her past few years. She noted that she wouldn't have much to say for the past few years and noted feeling unaccomplished after retirement. We worked on exploring this during the session. She noted a need to feel a sense of accomplishment with her time. We worked on exploring this. 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 She noted that it has been difficult for her to accomplish more simple tasks. We worked on exploring this during the session and possible barriers to engagement. She noted barriers  including not having a work-space to be able to get completer related asks completed. She noted how her drinking affects this process, as a whole, and noted the difficulty of limiting her volume. We continued to process the onset of her alcohol consumption during the time that her children were young. She noted her son having his Friday night rant and noted her effort to set boundaries with him and afterwards choosing not to engage any further. Therapist praised Tanya Barron for her effort during the session. She noted wondering why does he feel like he needs to attack to feel better. We worked on reviewing boundaries and assertiveness during the session. Therapist modeled this during the session.  Tanya Barron was engaged and receptive during the session. She expressed commitment towards goals. Therapist praised Tanya Barron for her effort during the session and provided supportive therapy. A follow-up was scheduled for continued treatment, which she benefits from.    Interventions: CBT & interpersonal.   Diagnosis:   No diagnosis found.  Psychiatric Treatment: Yes , see chart.   Treatment Plan:  Client Abilities/Strengths Tanya Barron is intelligent, self-aware, and motivated for change.    Support System: Family and friends.   Client Treatment Preferences Outpatient therapy.   Client Statement of Needs Tanya Barron would like to engage in consistent self-care (exercise, taking supplements, improving diet [mediterranean]),  problem-solving day-to-day issues, managing ADHD symptoms, increase motivation, engage in enjoyable activities, managing symptoms day to day, managing familial stressors, increasing socialization, verbalizing thoughts and feelings, building a routine, engage in regular meetings and reduce drinking.   Treatment Level Weekly  Symptoms  Anxiety: feeling anxious, difficulty managing worry, worrying about different things, trouble relaxing, restlessness, irritability, and feeling afraid something  awful might happen.   (Status: maintained) Depression: loss of interest, feeling down, fluctuating appetite, lethargy, fluctuating sleep, feeling bad about self, and psycho-motor retardation.   (Status: maintained)  Goals:   Tanya Barron experiences symptoms of depression and anxiety.  Treatment plan signed and available on s-drive:  No    Target Date: 01/22/25 Frequency: Weekly  Progress: 10% Modality: individual    Therapist will provide referrals for additional resources as appropriate.  Therapist will provide psycho-education regarding Tanya Barron's diagnosis and corresponding treatment approaches and interventions. Tanya Mullet, LCSW will support the patient's ability to achieve the goals identified. will employ CBT, BA, Problem-solving, Solution Focused, Mindfulness,  coping skills, & other evidenced-based practices will be used to promote progress towards healthy functioning to help manage decrease symptoms associated with her diagnosis.   Reduce overall level, frequency, and intensity of the feelings of depression, anxiety and evidenced by decreased overall symptoms from 6 to 7 days/week to 0 to 1 days/week per client report for at least 3 consecutive months. Verbally express understanding of the relationship between feelings of depression, anxiety and their impact on thinking patterns and behaviors. Verbalize an understanding of the role that distorted thinking plays in creating fears, excessive worry, and ruminations.  Tanya Barron participated in the creation of the treatment plan)    Tanya Mullet, LCSW

## 2024-07-25 DIAGNOSIS — M75121 Complete rotator cuff tear or rupture of right shoulder, not specified as traumatic: Secondary | ICD-10-CM | POA: Diagnosis not present

## 2024-07-31 ENCOUNTER — Telehealth: Payer: Self-pay

## 2024-07-31 NOTE — Telephone Encounter (Signed)
   Pre-operative Risk Assessment    Patient Name: Tanya Barron  DOB: 1951-10-27 MRN: [id number]   Date of last office visit: 07/22/21 Date of next office visit: Not scheduled   Request for Surgical Clearance    Procedure:  Right shoulder arthroscopic rotator cuff repair vs debridement, subacromial decompression  Date of Surgery:  Clearance 09/20/24                                Surgeon:  Eva Herring MD Surgeon's Group or Practice Name:  [facility] Phone number:  [phone] Fax number:  [phone]   Type of Clearance Requested:   - Medical    Type of Anesthesia:  Choice   Additional requests/questions:    Bonney Ival LOISE Gerome   07/31/2024, 1:12 PM

## 2024-07-31 NOTE — Telephone Encounter (Signed)
 1st attempt : Called patient, NA, left message on Vm to contact our office to schedule in office appointment for pre-op clearance.

## 2024-07-31 NOTE — Telephone Encounter (Signed)
   Name: Tanya Barron  DOB: 01/31/1951  MRN: [id number]  Primary Cardiologist: Shelda Bruckner, MD  Chart reviewed as part of pre-operative protocol coverage. Because of Tanya Barron's past medical history and time since last visit, she will require a follow-up in-office visit in order to better assess preoperative cardiovascular risk. Has not been seen in our office since 2022.  Pre-op covering staff: - Please schedule appointment and call patient to inform them. If patient already had an upcoming appointment within acceptable timeframe, please add pre-op clearance to the appointment notes so provider is aware. - Please contact requesting surgeon's office via preferred method (i.e, phone, fax) to inform them of need for appointment prior to surgery.   Nina Mondor E Theresa Wedel, NP  07/31/2024, 1:40 PM

## 2024-08-02 NOTE — Telephone Encounter (Signed)
 2nd attempt: Called patient to contact our office to schedule cardiac clearance telehealth appointment.  NA, left message on VM.

## 2024-08-02 NOTE — Telephone Encounter (Signed)
Patient has been scheduled for in office visit.

## 2024-08-20 ENCOUNTER — Ambulatory Visit: Admitting: Psychology

## 2024-08-20 DIAGNOSIS — F411 Generalized anxiety disorder: Secondary | ICD-10-CM

## 2024-08-20 DIAGNOSIS — F331 Major depressive disorder, recurrent, moderate: Secondary | ICD-10-CM

## 2024-08-20 NOTE — Progress Notes (Signed)
 [facility] Counselor/Therapist Progress Note  Patient ID: Tanya Barron, MRN: [id number]    Date: 08/20/24  Time Spent: 12:33 pm - 1:26 pm : 43 Minutes  Treatment Type: Individual Therapy.  Reported Symptoms: depression and anxiety.   Mental Status Exam: Appearance:  Neat and Well Groomed     Behavior: Appropriate  Motor: Normal  Speech/Language:  Clear and Coherent  Affect: Congruent  Mood: dysthymic  Thought process: normal  Thought content:   WNL  Sensory/Perceptual disturbances:   WNL  Orientation: oriented to person, place, time/date, and situation  Attention: Good  Concentration: Good  Memory: WNL  Fund of knowledge:  Good  Insight:   Good  Judgment:  Good  Impulse Control: Good   Risk Assessment: Danger to Self:  No Self-injurious Behavior: No Danger to Others: No Duty to Warn:no Physical Aggression / Violence:No  Access to Firearms a concern: No  Gang Involvement:No   Subjective:   Tanya Barron participated in the session, in person in the office with the therapist, and consented to treatment Tanya Barron reviewed the events of the past week. She noted feeling a good about getting some tasks done around the home, specifically organizing and having a yard sale. She noted her hope to continue her work in this area. She noted at least I am focusing on something that is really meaningful to me. She noted the various steps that she has taken since that time. She noted feeling more in  control as a result of her engagement in this project. She noted an upcoming shoulder surgery and noted that it will take 6 months to fully recuperate. She noted having support during the recovery time. She has not exercised at the [facility]. She noted recently learning that her friend's husband, from their home town, recently passed. She noted a need to be around other people to be her happiest. She noted her alcohol has been not very good, pretty steady and not over my  6 limit. She noted that her brother drinks as well, and will be drinking during his upcoming visit. She noted that their drinking is a way of life we have taken on. She noted her maternal aunts and uncles (the Tanya Barron side) were big drinkers, smokers too. She noted that drinking was all around us  but wasn't in our house. She noted her mother did not want Tanya Barron or her siblings to associate with that side of the family, as a result. We worked on processing how this affects her ability to manage her own drinking. She noted reflecting on a conversation with a psychiatric provider in which she was recommended to discontinue drinking before other treatment options would be available but noted often also thinking about whether its hurting her. We worked on processing her alcohol consumption and how this affect her ability to socialize. She acknowledged that drinking can be barrier to socializing and having relationships. However, she also noted that socializing often results in social anxiety. She noted that alcohol was often included as she socialized. She noted in college, feeling high levels of anxiety and noted feeling like she would faint. 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 We worked on processing this during the session. Therapist praised Tanya Barron for her vulnerability during the session. We will continue to explore this going forward. Therapist provided supportive therapy. A follow-up was scheduled for continued treatment, which she benefits from.   Interventions: CBT & interpersonal.   Diagnosis:   Major depressive disorder,  recurrent episode, moderate (HCC)  Generalized anxiety disorder  Psychiatric Treatment: Yes , see chart.   Treatment Plan:  Client Abilities/Strengths Tanya Barron is intelligent, self-aware, and motivated for change.    Support System: Family and friends.   Client Treatment Preferences Outpatient therapy.   Client Statement of  Needs Tanya Barron would like to engage in consistent self-care (exercise, taking supplements, improving diet [mediterranean]),  problem-solving day-to-day issues, managing ADHD symptoms, increase motivation, engage in enjoyable activities, managing symptoms day to day, managing familial stressors, increasing socialization, verbalizing thoughts and feelings, building a routine, engage in regular meetings and reduce drinking.   Treatment Level Weekly  Symptoms  Anxiety: feeling anxious, difficulty managing worry, worrying about different things, trouble relaxing, restlessness, irritability, and feeling afraid something awful might happen.   (Status: maintained) Depression: loss of interest, feeling down, fluctuating appetite, lethargy, fluctuating sleep, feeling bad about self, and psycho-motor retardation.   (Status: maintained)  Goals:   Tanya Barron experiences symptoms of depression and anxiety.  Treatment plan signed and available on s-drive:  No    Target Date: 01/22/25 Frequency: Weekly  Progress: 10% Modality: individual    Therapist will provide referrals for additional resources as appropriate.  Therapist will provide psycho-education regarding Tanya Barron's diagnosis and corresponding treatment approaches and interventions. Tanya Mullet, LCSW will support the patient's ability to achieve the goals identified. will employ CBT, BA, Problem-solving, Solution Focused, Mindfulness,  coping skills, & other evidenced-based practices will be used to promote progress towards healthy functioning to help manage decrease symptoms associated with her diagnosis.   Reduce overall level, frequency, and intensity of the feelings of depression, anxiety and evidenced by decreased overall symptoms from 6 to 7 days/week to 0 to 1 days/week per client report for at least 3 consecutive months. Verbally express understanding of the relationship between feelings of depression, anxiety and their impact on thinking patterns  and behaviors. Verbalize an understanding of the role that distorted thinking plays in creating fears, excessive worry, and ruminations.  Tanya Barron participated in the creation of the treatment plan)    Tanya Mullet, LCSW

## 2024-08-21 DIAGNOSIS — M8589 Other specified disorders of bone density and structure, multiple sites: Secondary | ICD-10-CM | POA: Diagnosis not present

## 2024-08-21 DIAGNOSIS — Z1231 Encounter for screening mammogram for malignant neoplasm of breast: Secondary | ICD-10-CM | POA: Diagnosis not present

## 2024-08-29 DIAGNOSIS — I700 Atherosclerosis of aorta: Secondary | ICD-10-CM | POA: Insufficient documentation

## 2024-08-29 DIAGNOSIS — R931 Abnormal findings on diagnostic imaging of heart and coronary circulation: Secondary | ICD-10-CM | POA: Insufficient documentation

## 2024-08-29 DIAGNOSIS — I10 Essential (primary) hypertension: Secondary | ICD-10-CM | POA: Insufficient documentation

## 2024-08-29 NOTE — Progress Notes (Unsigned)
 Cardiology Office Note   Date:  08/30/2024   ID:  Tanya Barron, DOB Feb 25, 1951, MRN [id number]  PCP:  Rolinda Millman, MD  Cardiologist:   Shelda Bruckner, MD Referring:  Rolinda Millman, MD  Chief Complaint  Patient presents with   Coronary Artery Disease   Pre-op Exam      History of Present Illness: Tanya Barron is a 73 y.o. female who presents for preop evaluation.    She saw Dr. Sallye in Sept 2022.  She had a previous CT with coronary calcium, pericardial calcium and aortic atherosclerosis.     She is being seen for preop clearance.  She is having arthroscopic shoulder surgery.  Despite having shoulder discomfort she is very active.  In July and August she was doing low impact aerobics at the [facility].  She has stairs in her house.  The patient denies any new symptoms such as chest discomfort, neck or arm discomfort. There has been no new shortness of breath, PND or orthopnea. There have been no reported palpitations, presyncope or syncope.    Past Medical History:  Diagnosis Date   HTN (hypertension)    Seasonal allergies     Past Surgical History:  Procedure Laterality Date   CARPAL TUNNEL RELEASE     SPINAL FUSION     TRIGGER FINGER RELEASE       Current Outpatient Medications  Medication Sig Dispense Refill   alendronate (FOSAMAX) 70 MG tablet Take 70 mg by mouth once a week.     amLODipine (NORVASC) 5 MG tablet      aspirin  EC 81 MG tablet Take 1 tablet (81 mg total) by mouth daily. Swallow whole. 90 tablet 3   cetirizine (ZYRTEC) 10 MG tablet Take by mouth.     pantoprazole (PROTONIX) 40 MG tablet Take 1 tablet by mouth daily.     PARoxetine (PAXIL) 40 MG tablet Take 60 mg by mouth daily.     rosuvastatin (CRESTOR) 40 MG tablet Take 1 tablet (40 mg total) by mouth daily. 90 tablet 3   triamterene-hydrochlorothiazide (DYAZIDE) 37.5-25 MG capsule      No current facility-administered medications for this visit.    Allergies:    Sulfamethoxazole-trimethoprim    Social History:  The patient  reports that she quit smoking about 40 years ago. Her smoking use included cigarettes. She started smoking about 52 years ago. She has a 12 pack-year smoking history. She has never used smokeless tobacco. She reports current alcohol use.   Family History:  The patient's family history includes Alzheimer's disease in her mother; Breast cancer in her paternal aunt; Heart disease in her father.    ROS:  Please see the history of present illness.   Otherwise, review of systems are positive for none.   All other systems are reviewed and negative.    PHYSICAL EXAM: VS:  BP 130/83 (BP Location: Left Arm, Patient Position: Sitting, Cuff Size: Normal)   Pulse 87   Ht 4' 10 (1.473 m)   Wt 142 lb 3.2 oz (64.5 kg)   SpO2 95%   BMI 29.72 kg/m  , BMI Body mass index is 29.72 kg/m. GENERAL:  Well appearing HEENT:  Pupils equal round and reactive, fundi not visualized, oral mucosa unremarkable NECK:  No jugular venous distention, waveform within normal limits, carotid upstroke brisk and symmetric, no bruits, no thyromegaly LYMPHATICS:  No cervical, inguinal adenopathy LUNGS:  Clear to auscultation bilaterally BACK:  No CVA tenderness CHEST:  Unremarkable HEART:  PMI  not displaced or sustained,S1 and S2 within normal limits, no S3, no S4, no clicks, no rubs, no murmurs ABD:  Flat, positive bowel sounds normal in frequency in pitch, no bruits, no rebound, no guarding, no midline pulsatile mass, no hepatomegaly, no splenomegaly EXT:  2 plus pulses throughout, no edema, no cyanosis no clubbing SKIN:  No rashes no nodules NEURO:  Cranial nerves II through XII grossly intact, motor grossly intact throughout [facility]:  Cognitively intact, oriented to person place and time    EKG:  EKG Interpretation Date/Time:  Thursday August 30 2024 09:19:01 EDT Ventricular Rate:  80 PR Interval:  136 QRS Duration:  76 QT Interval:  386 QTC  Calculation: 445 R Axis:   2  Text Interpretation: Normal sinus rhythm Low voltage QRS RSR' or QR pattern in V1 suggests right ventricular conduction delay Poor anterior R wave progression No significant change since last tracing 2022 Confirmed by Lavona Agent ([id number]) on 08/30/2024 12:29:19 PM Normal sinus rhythm, rate 80, poor anterior R wave progression, low voltage on the chest leads, no change from previous.    Recent Labs: No results found for requested labs within last 365 days.    Lipid Panel No results found for: CHOL, TRIG, HDL, CHOLHDL, VLDL, LDLCALC, LDLDIRECT    Wt Readings from Last 3 Encounters:  08/30/24 142 lb 3.2 oz (64.5 kg)  07/22/21 136 lb 9.6 oz (62 kg)  05/22/21 135 lb (61.2 kg)      Other studies Reviewed: Additional studies/ records that were reviewed today include: Labs. Review of the above records demonstrates:  Please see elsewhere in the note.     ASSESSMENT AND PLAN:  Coronary calcification, consistent with calcified CAD:   The patient has no symptoms related to this.  We had a long discussion about primary risk reduction.  She has a high functional level.  She needs continued primary risk reduction.  Aortic atherosclerosis: She will have risk reduction as below.  No change in therapy otherwise.  Hypertension: Her blood pressure is at target.  No change in therapy.  Dyslipidemia: I see from an old note that she was going to be on 40 mg of Lipitor but it looks like she is taking 20 mg of Crestor.  I am going to double the Crestor to 40 mg daily and check a lipid profile in 3 months.  I think the goal LDL should be in the 70s.  We talked about diet and exercise.  Preop: The patient has no high risk findings from a cardiovascular standpoint.  She has a high functional risk going for high risk procedure.  Therefore, according to ACC/AHA guidelines no change in therapy is indicated.   Current medicines are reviewed at length with the  patient today.  The patient does not have concerns regarding medicines.  The following changes have been made:  no change  Labs/ tests ordered today include:   Orders Placed This Encounter  Procedures   Lipid panel   ALT   EKG 12-Lead     Disposition:   FU with me as needed.     Signed, Agent Lavona, MD  08/30/2024 12:36 PM    [facility]

## 2024-08-30 ENCOUNTER — Ambulatory Visit: Attending: Cardiology | Admitting: Cardiology

## 2024-08-30 ENCOUNTER — Encounter: Payer: Self-pay | Admitting: Cardiology

## 2024-08-30 VITALS — BP 130/83 | HR 87 | Ht <= 58 in | Wt 142.2 lb

## 2024-08-30 DIAGNOSIS — I10 Essential (primary) hypertension: Secondary | ICD-10-CM

## 2024-08-30 DIAGNOSIS — Z79899 Other long term (current) drug therapy: Secondary | ICD-10-CM

## 2024-08-30 DIAGNOSIS — I700 Atherosclerosis of aorta: Secondary | ICD-10-CM

## 2024-08-30 DIAGNOSIS — R931 Abnormal findings on diagnostic imaging of heart and coronary circulation: Secondary | ICD-10-CM

## 2024-08-30 MED ORDER — ROSUVASTATIN CALCIUM 40 MG PO TABS: 40.0000 mg | ORAL_TABLET | Freq: Every day | ORAL | 3 refills | Status: AC

## 2024-08-30 NOTE — Patient Instructions (Addendum)
 Medication Instructions:  Please check your dose of crestor at home. Dr. Lavona recommends you take 40 mg of crestor daily.  *If you need a refill on your cardiac medications before your next appointment, please call your pharmacy*  Lab Work: Please complete a FASTING lipid panel and an ALT in 3 months at any [facility].  If you have labs (blood work) drawn today and your tests are completely normal, you will receive your results only by: MyChart Message (if you have MyChart) OR A paper copy in the mail If you have any lab test that is abnormal or we need to change your treatment, we will call you to review the results.  Testing/Procedures: None.  Follow-Up: At [facility], you and your health needs are our priority.  As part of our continuing mission to provide you with exceptional heart care, our providers are all part of one team.  This team includes your primary Cardiologist (physician) and Advanced Practice Providers or APPs (Physician Assistants and Nurse Practitioners) who all work together to provide you with the care you need, when you need it.  Your next appointment will be as needed and it will be with:     Provider:   Dr. DOROTHA Lavona, MD

## 2024-09-17 ENCOUNTER — Ambulatory Visit: Admitting: Psychology

## 2024-09-18 NOTE — Telephone Encounter (Signed)
 Patient is cleared. Office note faxed to requesting provider and confirmation received.

## 2024-09-18 NOTE — Telephone Encounter (Signed)
 Requesting office inquiring of pt has been cleared. Pt has recent appt with Dr. Lavona. Proc is 09/20/24

## 2024-10-04 DIAGNOSIS — Y999 Unspecified external cause status: Secondary | ICD-10-CM | POA: Diagnosis not present

## 2024-10-04 DIAGNOSIS — M75121 Complete rotator cuff tear or rupture of right shoulder, not specified as traumatic: Secondary | ICD-10-CM | POA: Diagnosis not present

## 2024-10-04 DIAGNOSIS — M25811 Other specified joint disorders, right shoulder: Secondary | ICD-10-CM | POA: Diagnosis not present

## 2024-10-04 DIAGNOSIS — M94211 Chondromalacia, right shoulder: Secondary | ICD-10-CM | POA: Diagnosis not present

## 2024-10-04 DIAGNOSIS — X58XXXA Exposure to other specified factors, initial encounter: Secondary | ICD-10-CM | POA: Diagnosis not present

## 2024-10-04 DIAGNOSIS — M65811 Other synovitis and tenosynovitis, right shoulder: Secondary | ICD-10-CM | POA: Diagnosis not present

## 2024-10-04 DIAGNOSIS — S43431A Superior glenoid labrum lesion of right shoulder, initial encounter: Secondary | ICD-10-CM | POA: Diagnosis not present

## 2024-10-15 ENCOUNTER — Ambulatory Visit: Admitting: Psychology

## 2024-10-15 DIAGNOSIS — F411 Generalized anxiety disorder: Secondary | ICD-10-CM

## 2024-10-15 DIAGNOSIS — F331 Major depressive disorder, recurrent, moderate: Secondary | ICD-10-CM | POA: Diagnosis not present

## 2024-10-15 NOTE — Progress Notes (Signed)
 [facility] Counselor/Therapist Progress Note  Patient ID: Tanya Barron, MRN: [id number]    Date: 10/15/24  Time Spent: 12:31 pm - 1:22 pm : 51 Minutes  Treatment Type: Individual Therapy.  Reported Symptoms: depression and anxiety.   Mental Status Exam: Appearance:  Neat and Well Groomed     Behavior: Appropriate  Motor: Normal  Speech/Language:  Clear and Coherent  Affect: Congruent  Mood: dysthymic  Thought process: normal  Thought content:   WNL  Sensory/Perceptual disturbances:   WNL  Orientation: oriented to person, place, time/date, and situation  Attention: Good  Concentration: Good  Memory: WNL  Fund of knowledge:  Good  Insight:   Good  Judgment:  Good  Impulse Control: Good   Risk Assessment: Danger to Self:  No Self-injurious Behavior: No Danger to Others: No Duty to Warn:no Physical Aggression / Violence:No  Access to Firearms a concern: No  Gang Involvement:No   Subjective:   Tanya Barron participated in the session, in person in the office with the therapist, and consented to treatment Tanya Barron reviewed the events of the past week. Tanya Barron noted missing the most recent appoint due to an error on her part. She noted recent shoulder surgery. She noted not liking to take meds and currently just taking Tylenol. She noted that she continues to consume alcohol. 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 She noted making overall improvements in interpersonal stressors but noted stagnation regarding progress with her alcohol consumption. She noted liking the cheerful part that it brings to the table. She noted her difficulty modulating her intake and that one beer can turn into two, three... She noted her goal to modulate her drinking but not discontinuing. We worked on exploring this and therapist highlighted rationalization. She noted that AA has not appealed  to her, overall. She noted various barriers including being in a role, as parent. She noted acknowledging her lack of control would be difficult. Therapist highlighted the availability of alternative programs to AA. Tanya Barron noted being open to other approaches. Therapist encouraged Tanya Barron to research alternatives to AA and pursue additional resources in this area. Therapist validated Tanya Barron's feelings and experience, challenged distortions, highlighted defense mechanisms, and provided supportive therapy.Tanya Barron noted her interest in extending times between sessions. We explored this. Therapist discussed Tanya Barron's ability to schedule sooner, should the need arise. A follow-up was scheduled for continued treatment which Tanya Barron benefits from.   Interventions: CBT   Diagnosis:   Major depressive disorder, recurrent episode, moderate (HCC)  Generalized anxiety disorder  Psychiatric Treatment: Yes , see chart.   Treatment Plan:  Client Abilities/Strengths Tanya Barron is intelligent, self-aware, and motivated for change.    Support System: Family and friends.   Client Treatment Preferences Outpatient therapy.   Client Statement of Needs Tanya Barron would like to engage in consistent self-care (exercise, taking supplements, improving diet [mediterranean]),  problem-solving day-to-day issues, managing ADHD symptoms, increase motivation, engage in enjoyable activities, managing symptoms day to day, managing familial stressors, increasing socialization, verbalizing thoughts and feelings, building a routine, engage in regular meetings and reduce drinking.   Treatment Level Weekly  Symptoms  Anxiety: feeling anxious, difficulty managing worry, worrying about different things, trouble relaxing, restlessness, irritability, and feeling afraid something awful might happen.   (Status: maintained) Depression: loss of interest, feeling down, fluctuating appetite, lethargy, fluctuating sleep, feeling bad about self,  and psycho-motor retardation.   (Status: maintained)  Goals:   Tanya experiences  symptoms of depression and anxiety.  Treatment plan signed and available on s-drive:  No    Target Date: 01/22/25 Frequency: Weekly  Progress: 10% Modality: individual    Therapist will provide referrals for additional resources as appropriate.  Therapist will provide psycho-education regarding Tanya Barron's diagnosis and corresponding treatment approaches and interventions. Tanya Mullet, LCSW will support the patient's ability to achieve the goals identified. will employ CBT, BA, Problem-solving, Solution Focused, Mindfulness,  coping skills, & other evidenced-based practices will be used to promote progress towards healthy functioning to help manage decrease symptoms associated with her diagnosis.   Reduce overall level, frequency, and intensity of the feelings of depression, anxiety and evidenced by decreased overall symptoms from 6 to 7 days/week to 0 to 1 days/week per client report for at least 3 consecutive months. Verbally express understanding of the relationship between feelings of depression, anxiety and their impact on thinking patterns and behaviors. Verbalize an understanding of the role that distorted thinking plays in creating fears, excessive worry, and ruminations.  [name] participated in the creation of the treatment plan)    Tanya Mullet, LCSW

## 2024-12-17 ENCOUNTER — Ambulatory Visit: Admitting: Psychology
